# Patient Record
Sex: Female | Born: 1955 | Race: White | Hispanic: No | Smoking: Never smoker
Health system: Southern US, Academic
[De-identification: ages and names within clinical notes are randomized; demographics above are authoritative.]

## PROBLEM LIST (undated history)

## (undated) DIAGNOSIS — E119 Type 2 diabetes mellitus without complications: Secondary | ICD-10-CM

## (undated) DIAGNOSIS — G43909 Migraine, unspecified, not intractable, without status migrainosus: Secondary | ICD-10-CM

## (undated) DIAGNOSIS — K219 Gastro-esophageal reflux disease without esophagitis: Secondary | ICD-10-CM

## (undated) DIAGNOSIS — I10 Essential (primary) hypertension: Secondary | ICD-10-CM

## (undated) HISTORY — DX: Type 2 diabetes mellitus without complications: E11.9

## (undated) HISTORY — DX: Essential (primary) hypertension: I10

## (undated) HISTORY — DX: Migraine, unspecified, not intractable, without status migrainosus: G43.909

## (undated) HISTORY — PX: HX LAP CHOLECYSTECTOMY: SHX56

## (undated) HISTORY — DX: Gastro-esophageal reflux disease without esophagitis: K21.9

## (undated) HISTORY — PX: HX HYSTERECTOMY: SHX81

---

## 1998-06-10 ENCOUNTER — Other Ambulatory Visit (HOSPITAL_COMMUNITY): Payer: Self-pay | Admitting: OBSTETRICS/GYNECOLOGY

## 2014-11-29 DIAGNOSIS — G5603 Carpal tunnel syndrome, bilateral upper limbs: Secondary | ICD-10-CM | POA: Insufficient documentation

## 2014-11-29 DIAGNOSIS — E119 Type 2 diabetes mellitus without complications: Secondary | ICD-10-CM | POA: Insufficient documentation

## 2014-11-29 DIAGNOSIS — I10 Essential (primary) hypertension: Secondary | ICD-10-CM | POA: Insufficient documentation

## 2014-11-29 DIAGNOSIS — M4807 Spinal stenosis, lumbosacral region: Secondary | ICD-10-CM | POA: Insufficient documentation

## 2014-11-29 DIAGNOSIS — N3941 Urge incontinence: Secondary | ICD-10-CM | POA: Insufficient documentation

## 2015-03-07 DIAGNOSIS — J309 Allergic rhinitis, unspecified: Secondary | ICD-10-CM | POA: Insufficient documentation

## 2015-09-13 DIAGNOSIS — E1140 Type 2 diabetes mellitus with diabetic neuropathy, unspecified: Secondary | ICD-10-CM | POA: Insufficient documentation

## 2017-11-03 DIAGNOSIS — K649 Unspecified hemorrhoids: Secondary | ICD-10-CM | POA: Insufficient documentation

## 2017-11-03 DIAGNOSIS — E785 Hyperlipidemia, unspecified: Secondary | ICD-10-CM | POA: Insufficient documentation

## 2017-11-03 DIAGNOSIS — K219 Gastro-esophageal reflux disease without esophagitis: Secondary | ICD-10-CM | POA: Insufficient documentation

## 2017-11-03 DIAGNOSIS — I10 Essential (primary) hypertension: Secondary | ICD-10-CM | POA: Insufficient documentation

## 2017-11-03 DIAGNOSIS — M1990 Unspecified osteoarthritis, unspecified site: Secondary | ICD-10-CM | POA: Insufficient documentation

## 2019-08-09 DIAGNOSIS — R5383 Other fatigue: Secondary | ICD-10-CM | POA: Insufficient documentation

## 2019-08-09 DIAGNOSIS — K5904 Chronic idiopathic constipation: Secondary | ICD-10-CM | POA: Insufficient documentation

## 2021-07-16 DIAGNOSIS — K5900 Constipation, unspecified: Secondary | ICD-10-CM | POA: Insufficient documentation

## 2021-09-04 DIAGNOSIS — M7121 Synovial cyst of popliteal space [Baker], right knee: Secondary | ICD-10-CM | POA: Insufficient documentation

## 2022-07-20 ENCOUNTER — Ambulatory Visit (INDEPENDENT_AMBULATORY_CARE_PROVIDER_SITE_OTHER): Payer: 59 | Admitting: NURSE PRACTITIONER

## 2022-07-20 ENCOUNTER — Other Ambulatory Visit: Payer: Self-pay

## 2022-07-20 ENCOUNTER — Encounter (INDEPENDENT_AMBULATORY_CARE_PROVIDER_SITE_OTHER): Payer: Self-pay | Admitting: NURSE PRACTITIONER

## 2022-07-20 VITALS — Ht 64.0 in | Wt 203.0 lb

## 2022-07-20 DIAGNOSIS — J343 Hypertrophy of nasal turbinates: Secondary | ICD-10-CM

## 2022-07-20 DIAGNOSIS — J3089 Other allergic rhinitis: Secondary | ICD-10-CM

## 2022-07-20 DIAGNOSIS — J329 Chronic sinusitis, unspecified: Secondary | ICD-10-CM

## 2022-07-20 MED ORDER — DOXYCYCLINE HYCLATE 100 MG CAPSULE
100.0000 mg | ORAL_CAPSULE | Freq: Two times a day (BID) | ORAL | 0 refills | Status: DC
Start: 2022-07-20 — End: 2022-09-28

## 2022-07-20 NOTE — Progress Notes (Signed)
ENT, [facility]  [address]  [place] [address]  Phone: [phone]  Fax: [phone]      Encounter Date: 07/20/2022    Patient ID: Miranda Barker  MRN: [id number]    DOB: 1956-10-08  Age: 66 y.o. female         Referring Provider:    No referring provider defined for this encounter.    Reason for Visit:   Chief Complaint   Patient presents with    Sinus Problem     Pt complains of PND, sinus pressure, nasal congestion, sore throat and cough        History of Present Illness:  Miranda Barker is a 66 y.o. female referred for chronic sinusitis.  She complains of daily PND, nasal congestion, and sinus pressure.  She is currently taking Flonase and zyrtec.  She had allergy testing over 15 years ago.  She recently was treated  for sinusitis with Amoxicillin and caused severe yeast infection.      Patient History:  There is no problem list on file for this patient.    Current Outpatient Medications   Medication Sig    doxycycline hyclate (VIBRAMYCIN) 100 mg Oral Capsule Take 1 Capsule (100 mg total) by mouth Twice daily    ergocalciferol, vitamin D2, (DRISDOL) 1,250 mcg (50,000 unit) Oral Capsule     esomeprazole magnesium (NEXIUM) 40 mg Oral Capsule, Delayed Release(E.C.) Take 1 Capsule (40 mg total) by mouth Once a day    famotidine (PEPCID) 20 mg Oral Tablet TAKE 1 TABLET BY MOUTH TWICE DAILY AS DIRECTED    fluticasone propionate (FLONASE) 50 mcg/actuation Nasal Spray, Suspension     glyburide (DIABETA) 1.25 mg Oral Tablet Take 1 Tablet (1.25 mg total) by mouth Every morning    losartan (COZAAR) 100 mg Oral Tablet     metFORMIN (GLUCOPHAGE) 500 mg Oral Tablet     pravastatin (PRAVACHOL) 40 mg Oral Tablet      Allergies   Allergen Reactions    Sulfa (Sulfonamides)      Past Medical History:   Diagnosis Date    Diabetes mellitus (CMS HCC)     Esophageal reflux     Essential hypertension     Migraine       Past Surgical History:   Procedure Laterality Date    HX LAP CHOLECYSTECTOMY        Family  Medical History:    None         Social History     Tobacco Use    Smoking status: Never    Smokeless tobacco: Never   Substance Use Topics    Alcohol use: Never    Drug use: Never       Review of Systems     Vitals:    07/20/22 1058   Weight: 92.1 kg (203 lb)   Height: 1.626 m (5\' 4" )   BMI: 34.92      ENT Physical Exam  Constitutional  Appearance: patient appears well-developed, well-nourished and well-groomed,  Communication/Voice: communication appropriate for developmental age; vocal quality normal;  Head and Face  Appearance: head appears normal, face appears normal and face appears atraumatic;  Palpation: facial palpation normal;  Salivary: glands normal;  Ear  Hearing: intact;  Auricles: right auricle normal; left auricle normal;  External Mastoids: right external mastoid normal; left external mastoid normal;  Ear Canals: right ear canal normal; left ear canal normal;  Tympanic Membranes: right tympanic membrane normal; left tympanic membrane normal;  Nose  External Nose: nares patent bilaterally; external nose normal;  Internal Nose: bilateral intranasal mucosa edematous; nasal septal deviation present; bilateral inferior turbinates with hypertrophy;  Oral Cavity/Oropharynx  Lips: normal;  Teeth: normal;  Gums: gingiva normal;  Tongue: normal;  Oral mucosa: normal;  Hard palate: normal;  Neck  Neck: neck normal; neck palpation normal;  Thyroid: thyroid normal;  Respiratory  Inspection: breathing unlabored; normal breathing rate;  Lymphatic  Palpation: no cervical adenopathy noted;  Neurovestibular  Mental Status: alert and oriented;  Psychiatric: mood normal; affect is appropriate;  Cranial Nerves: cranial nerves intact;     Assessment:  ENCOUNTER DIAGNOSES     ICD-10-CM   1. Chronic sinusitis, unspecified location  J32.9   2. Non-seasonal allergic rhinitis, unspecified trigger  J30.89   3. Nasal turbinate hypertrophy  J34.3       Plan:  Medical records reviewed on 07/20/2022.  Nasal endoscopy shows acute  sinusitis findings, will start doxycycline 100 mg BID and nasal irrigations daily  Allergy testing at follow up and treatment plan pending test results.    Repeat nasal endoscopy at follow up    Orders Placed This Encounter    31231 - NASAL ENDOSCOPY DIAGNOSTIC UNILATERAL OR BILATERAL (AMB ONLY)    doxycycline hyclate (VIBRAMYCIN) 100 mg Oral Capsule     Return for SET in 3 weeks.    Elnora Morrison, FNP-BC  07/20/2022, 11:38

## 2022-07-28 NOTE — Procedures (Signed)
ENT, [facility]  [address]  [place] [address]    Procedure Note    Name: Miranda Barker MRN:  [id number]   Date: 07/20/2022 Age: 66 y.o.       31231 - NASAL ENDOSCOPY DIAGNOSTIC UNILATERAL OR BILATERAL (AMB ONLY)  Performed by: Elnora Morrison, FNP-BC  Authorized by: Elnora Morrison, FNP-BC     Time Out:     Immediately before the procedure, a time out was called:  Yes    Patient verified:  Yes    Procedure Verified:  Yes    Site Verified:  Yes  Documentation:      Indications for procedure: Obstructive nasal breathing and Facial pain / Headache    Anesthesia: Oxymetazoline nasal spray    Description: Nasal endoscopy with rigid scope was performed with examination of the  septum, inferior, middle, and superior meatus, turbinates, sphenoethmoidal recess, and nasopharynx.     There is mucopus bilateral nasal cavity. ET orifices and nasopharynx were normal.     Findings: Inferior turbinate hypertrophy and Acute sinusitis    The patient tolerated the procedure well.             Elnora Morrison, FNP-BC

## 2022-08-13 ENCOUNTER — Ambulatory Visit (INDEPENDENT_AMBULATORY_CARE_PROVIDER_SITE_OTHER): Payer: 59 | Admitting: NURSE PRACTITIONER

## 2022-08-13 ENCOUNTER — Other Ambulatory Visit: Payer: 59 | Attending: NURSE PRACTITIONER

## 2022-08-13 ENCOUNTER — Encounter (INDEPENDENT_AMBULATORY_CARE_PROVIDER_SITE_OTHER): Payer: Self-pay | Admitting: NURSE PRACTITIONER

## 2022-08-13 ENCOUNTER — Other Ambulatory Visit (INDEPENDENT_AMBULATORY_CARE_PROVIDER_SITE_OTHER): Payer: Self-pay

## 2022-08-13 ENCOUNTER — Other Ambulatory Visit (HOSPITAL_COMMUNITY): Payer: 59 | Admitting: NURSE PRACTITIONER

## 2022-08-13 ENCOUNTER — Other Ambulatory Visit: Payer: Self-pay

## 2022-08-13 VITALS — Ht 64.0 in | Wt 203.0 lb

## 2022-08-13 DIAGNOSIS — J329 Chronic sinusitis, unspecified: Secondary | ICD-10-CM

## 2022-08-13 DIAGNOSIS — J3089 Other allergic rhinitis: Secondary | ICD-10-CM

## 2022-08-13 MED ORDER — GUAIFENESIN ER 600 MG TABLET, EXTENDED RELEASE 12 HR
600.0000 mg | EXTENDED_RELEASE_TABLET | Freq: Two times a day (BID) | ORAL | 0 refills | Status: AC
Start: 2022-08-13 — End: ?

## 2022-08-13 NOTE — Procedures (Signed)
[facility], [facility]  [address]  [place] [address]    Procedure Note    Name: JESENYA BOWDITCH MRN:  [id number]   Date: 08/13/2022 Age: 66 y.o.  DOB:   [birth year]       31231 - NASAL ENDOSCOPY DIAGNOSTIC UNILATERAL OR BILATERAL (AMB ONLY)  Performed by: Elnora Morrison, FNP-BC  Authorized by: Elnora Morrison, FNP-BC     Time Out:     Immediately before the procedure, a time out was called:  Yes    Patient verified:  Yes    Procedure Verified:  Yes    Site Verified:  Yes  Documentation:      Indications for procedure: Obstructive nasal breathing and Facial Pain    Anesthesia: Oxymetazoline nasal spray    Description: Nasal endoscopy with rigid scope was performed with examination of the  septum, inferior, middle, and superior meatus, turbinates, sphenoethmoidal recess, and nasopharynx.     There is mucopus bilateral nasal cavity and nasopharynx.  ET orifices and nasopharynx were normal.     Findings: Septal deviation, Inferior turbinate hypertrophy, and Acute sinusitis    The patient tolerated the procedure well.             Elnora Morrison, FNP-BC

## 2022-08-13 NOTE — Progress Notes (Signed)
ENT, [facility]  [address]  [place] [address]  Phone: [phone]  Fax: [phone]      Encounter Date: 08/13/2022    Patient ID: Miranda Barker  MRN: [id number]    DOB: 08-Oct-1956  Age: 67 y.o. female     Progress Note       Referring Provider:  No ref. provider found    Reason for Visit:   Chief Complaint   Patient presents with    Sinus Problem     3 week rc, pt complains of nasal congestion, PND, sore throat and cough        History of Present Illness:  Miranda Barker is a 66 y.o. female follow up sinusitis. She finished doxycycline and no improvement.  She is using nasal irrigations daily.  No improvement in symptoms.  Continues to have PND and sinus pressure constant.      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    doxycycline hyclate (VIBRAMYCIN) 100 mg Oral Capsule Take 1 Capsule (100 mg total) by mouth Twice daily (Patient not taking: Reported on 08/13/2022)    ergocalciferol, vitamin D2, (DRISDOL) 1,250 mcg (50,000 unit) Oral Capsule     esomeprazole magnesium (NEXIUM) 40 mg Oral Capsule, Delayed Release(E.C.) Take 1 Capsule (40 mg total) by mouth Once a day    famotidine (PEPCID) 20 mg  Oral Tablet TAKE 1 TABLET BY MOUTH TWICE DAILY AS DIRECTED    fluticasone propionate (FLONASE) 50 mcg/actuation Nasal Spray, Suspension     glyburide (DIABETA) 1.25 mg Oral Tablet Take 1 Tablet (1.25 mg total) by mouth Every morning    guaiFENesin (MUCINEX) 600 mg Oral Tablet Extended Release 12hr Take 1 Tablet (600 mg total) by mouth Every 12 hours    losartan (COZAAR) 100 mg Oral Tablet     metFORMIN (GLUCOPHAGE) 500 mg Oral Tablet     pravastatin (PRAVACHOL) 40 mg Oral Tablet       Allergies   Allergen Reactions    Tetracycline Nausea/ Vomiting    Bismuth Subcit K-Metronidz-Tcn Nausea/ Vomiting    Cefuroxime Axetil Hives/ Urticaria    Sulfa (Sulfonamides)      Past Medical History:   Diagnosis Date    Diabetes mellitus (CMS HCC)     Esophageal reflux     Essential hypertension     Migraine      Past Surgical History:   Procedure Laterality Date    HX LAP CHOLECYSTECTOMY       Family Medical History:    None  Social History     Tobacco Use    Smoking status: Never    Smokeless tobacco: Never   Substance Use Topics    Alcohol use: Never    Drug use: Never       Review of Systems   Constitutional: Negative.    HENT:  Positive for congestion, postnasal drip, sinus pressure and sinus pain.    Eyes: Negative.    Respiratory:  Positive for cough.    Gastrointestinal: Negative.    Allergic/Immunologic: Negative.    Neurological: Negative.    Hematological: Negative.    Psychiatric/Behavioral: Negative.        Vitals:    08/13/22 1543   Weight: 92.1 kg (203 lb)   Height: 1.626 m (5\' 4" )   BMI: 34.92      ENT Physical Exam  Constitutional  Appearance: patient appears well-developed, well-nourished and well-groomed,  Communication/Voice: communication appropriate for developmental age; vocal quality normal;  Head and Face  Appearance: head appears normal, face appears normal and face appears atraumatic;  Palpation: facial palpation normal;  Salivary: glands normal;  Ear  Hearing: intact;  Auricles: right auricle  normal; left auricle normal;  External Mastoids: right external mastoid normal; left external mastoid normal;  Ear Canals: right ear canal normal; left ear canal normal;  Tympanic Membranes: right tympanic membrane normal; left tympanic membrane normal;  Nose  External Nose: nares patent bilaterally; external nose normal;  Internal Nose: bilateral intranasal mucosa edematous; nasal septal deviation present; bilateral inferior turbinates with hypertrophy;  Oral Cavity/Oropharynx  Lips: normal;  Teeth: normal;  Gums: gingiva normal;  Tongue: normal;  Oral mucosa: normal;  Hard palate: normal;  Neck  Neck: neck normal; neck palpation normal;  Thyroid: thyroid normal;  Respiratory  Inspection: breathing unlabored; normal breathing rate;  Lymphatic  Palpation: no cervical adenopathy noted;  Neurovestibular  Mental Status: alert and oriented;  Psychiatric: mood normal; affect is appropriate;  Cranial Nerves: cranial nerves intact;     Assessment:  ENCOUNTER DIAGNOSES     ICD-10-CM   1. Chronic sinusitis, unspecified location  J32.9   2. Non-seasonal allergic rhinitis, unspecified trigger  J30.89       Plan:  Medical records reviewed on 08/13/2022.  Nasal culture done, will call her with results and further treatment plan  Mucinex 600 mg BID  CT sinuses to be scheduled  Allergy testing at follow up   Continue nasal irrigations daily to BID      Orders Placed This Encounter    31231 - NASAL ENDOSCOPY DIAGNOSTIC UNILATERAL OR BILATERAL (AMB ONLY)    RESPIRATORY CULTURE AND GRAM STAIN, AEROBIC    CT SINUSES WO IV CONTRAST    guaiFENesin (MUCINEX) 600 mg Oral Tablet Extended Release 12hr     No follow-ups on file.    08/15/2022, FNP-BC  08/13/2022, 16:06

## 2022-08-14 ENCOUNTER — Telehealth (INDEPENDENT_AMBULATORY_CARE_PROVIDER_SITE_OTHER): Payer: Self-pay | Admitting: NURSE PRACTITIONER

## 2022-08-16 LAB — RESPIRATORY CULTURE AND GRAM STAIN (PERFORMABLE): GRAM STAIN: NONE SEEN

## 2022-08-19 ENCOUNTER — Telehealth (INDEPENDENT_AMBULATORY_CARE_PROVIDER_SITE_OTHER): Payer: Self-pay | Admitting: NURSE PRACTITIONER

## 2022-08-19 NOTE — Telephone Encounter (Signed)
Pt called asking if the results of her nose culture were back yet

## 2022-09-07 ENCOUNTER — Other Ambulatory Visit: Payer: Self-pay

## 2022-09-07 ENCOUNTER — Inpatient Hospital Stay
Admission: RE | Admit: 2022-09-07 | Discharge: 2022-09-07 | Disposition: A | Discharge status: Home / Self Care | Payer: 59 | Source: Ambulatory Visit | Attending: NURSE PRACTITIONER | Admitting: NURSE PRACTITIONER

## 2022-09-07 DIAGNOSIS — J329 Chronic sinusitis, unspecified: Secondary | ICD-10-CM | POA: Insufficient documentation

## 2022-09-21 ENCOUNTER — Encounter (INDEPENDENT_AMBULATORY_CARE_PROVIDER_SITE_OTHER): Payer: Self-pay | Admitting: NURSE PRACTITIONER

## 2022-09-28 ENCOUNTER — Other Ambulatory Visit: Payer: Self-pay

## 2022-09-28 ENCOUNTER — Encounter (INDEPENDENT_AMBULATORY_CARE_PROVIDER_SITE_OTHER): Payer: Self-pay | Admitting: NURSE PRACTITIONER

## 2022-09-28 ENCOUNTER — Ambulatory Visit (INDEPENDENT_AMBULATORY_CARE_PROVIDER_SITE_OTHER): Payer: 59 | Admitting: NURSE PRACTITIONER

## 2022-09-28 VITALS — Ht 64.0 in | Wt 203.0 lb

## 2022-09-28 DIAGNOSIS — J3489 Other specified disorders of nose and nasal sinuses: Secondary | ICD-10-CM

## 2022-09-28 DIAGNOSIS — J329 Chronic sinusitis, unspecified: Secondary | ICD-10-CM

## 2022-09-28 DIAGNOSIS — J3089 Other allergic rhinitis: Secondary | ICD-10-CM

## 2022-09-28 DIAGNOSIS — J343 Hypertrophy of nasal turbinates: Secondary | ICD-10-CM

## 2022-09-28 MED ORDER — LEVOCETIRIZINE 5 MG TABLET
5.0000 mg | ORAL_TABLET | Freq: Every evening | ORAL | 3 refills | Status: AC
Start: 2022-09-28 — End: ?

## 2022-09-28 MED ORDER — FLUNISOLIDE 25 MCG (0.025 %) NASAL SPRAY
2.0000 | Freq: Two times a day (BID) | NASAL | 5 refills | Status: AC
Start: 2022-09-28 — End: ?

## 2022-09-28 NOTE — Progress Notes (Signed)
ENT, [facility]  [place] [address]  Phone: [phone]  Fax: [phone]      Encounter Date: 09/28/2022    Patient ID: Miranda Barker  MRN: [id number]    DOB: January 07, 1956  Age: 66 y.o. female     Progress Note       Referring Provider:  No ref. provider found    Reason for Visit:   Chief Complaint   Patient presents with    Follow-up After Testing     Rc after ct, pt complains of sneezing, watery eyes, PND, nasal congestion, cough and sore throat        History of Present Illness:  Miranda Barker is a 67 y.o. female follow up sinusitis.  CT sinuses completed due to persistent sinusitis symptoms and multiple antibiotics. She is having increased nasal congestion and PND for several week.  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)    Osteoarthrosis    Spinal stenosis of lumbosacral region    Synovial cyst of right popliteal space    Type 2 diabetes mellitus (CMS HCC)    Urge incontinence of urine     Current Outpatient Medications   Medication Sig    albuterol sulfate (PROVENTIL OR VENTOLIN OR PROAIR) 90 mcg/actuation Inhalation oral inhaler Take 2 Puffs by inhalation Every 4 hours as needed    ergocalciferol, vitamin D2, (DRISDOL) 1,250 mcg (50,000 unit) Oral Capsule     esomeprazole magnesium (NEXIUM) 40 mg Oral Capsule, Delayed Release(E.C.) Take 1 Capsule (40 mg total) by mouth Once a day    famotidine (PEPCID) 20 mg Oral Tablet TAKE 1 TABLET BY MOUTH TWICE DAILY AS DIRECTED    Flunisolide (NASALIDE) 25 mcg (0.025 %) Nasal Spray, Non-Aerosol Administer 2 Sprays into affected nostril(s) Twice daily    glyburide (DIABETA) 1.25 mg Oral Tablet  Take 1 Tablet (1.25 mg total) by mouth Every morning    guaiFENesin (MUCINEX) 600 mg Oral Tablet Extended Release 12hr Take 1 Tablet (600 mg total) by mouth Every 12 hours    Levocetirizine (XYZAL) 5 mg Oral Tablet Take 1 Tablet (5 mg total) by mouth Every evening    losartan (COZAAR) 100 mg Oral Tablet     metFORMIN (GLUCOPHAGE) 500 mg Oral Tablet     pravastatin (PRAVACHOL) 40 mg Oral Tablet       Allergies   Allergen Reactions    Tetracycline Nausea/ Vomiting    Bismuth Subcit K-Metronidz-Tcn Nausea/ Vomiting    Cefuroxime Axetil Hives/ Urticaria    Sulfa (Sulfonamides)      Past Medical History:   Diagnosis Date    Diabetes mellitus (CMS HCC)     Esophageal reflux     Essential hypertension     Migraine      Past Surgical History:   Procedure Laterality Date    HX LAP CHOLECYSTECTOMY       Family Medical History:    None         Social History     Tobacco Use    Smoking status:  Never    Smokeless tobacco: Never   Substance Use Topics    Alcohol use: Never    Drug use: Never       Review of Systems     Vitals:    09/28/22 1342   Weight: 92.1 kg (203 lb)   Height: 1.626 m (5\' 4" )   BMI: 34.92      ENT Physical Exam  Constitutional  Appearance: patient appears well-developed, well-nourished and well-groomed,  Communication/Voice: communication appropriate for developmental age; vocal quality normal;  Head and Face  Appearance: head appears normal, face appears normal and face appears atraumatic;  Palpation: facial palpation normal;  Salivary: glands normal;  Ear  Hearing: intact;  Auricles: right auricle normal; left auricle normal;  External Mastoids: right external mastoid normal; left external mastoid normal;  Ear Canals: right ear canal normal; left ear canal normal;  Tympanic Membranes: right tympanic membrane normal; left tympanic membrane normal;  Nose  External Nose: nares patent bilaterally; external nose normal;  Internal Nose: bilateral intranasal mucosa edematous; nasal septal deviation present;  bilateral inferior turbinates with hypertrophy;  Oral Cavity/Oropharynx  Lips: normal;  Teeth: normal;  Gums: gingiva normal;  Tongue: normal;  Oral mucosa: normal;  Hard palate: normal;  Neck  Neck: neck normal; neck palpation normal;  Thyroid: thyroid normal;  Respiratory  Inspection: breathing unlabored; normal breathing rate;  Lymphatic  Palpation: no cervical adenopathy noted;  Neurovestibular  Mental Status: alert and oriented;  Psychiatric: mood normal; affect is appropriate;  Cranial Nerves: cranial nerves intact;       Assessment:  ENCOUNTER DIAGNOSES     ICD-10-CM   1. 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 Nasal turbinate hypertrophy  J34.3   4. Chronic sinusitis, unspecified location  J32.9       Plan:  Medical records reviewed on 09/28/2022.  Reviewed CT sinuses report and images with patient.  Right maxillary retention cyst.  Also noted on my interpretation is bilateral concha bullosa and a very small mucosal cyst left maxillary sinus.  Discussed turbinate surgery, she does not want surgery  Will start Xyzal 5 mg daily and flunisolide spray daily to BID  Allergy testing at follow up.    Orders Placed This Encounter    Flunisolide (NASALIDE) 25 mcg (0.025 %) Nasal Spray, Non-Aerosol    Levocetirizine (XYZAL) 5 mg Oral Tablet     Return for SET.    09/30/2022, FNP-BC  09/28/2022, 13:47

## 2022-10-26 ENCOUNTER — Encounter (INDEPENDENT_AMBULATORY_CARE_PROVIDER_SITE_OTHER): Payer: Self-pay | Admitting: NURSE PRACTITIONER

## 2022-10-26 ENCOUNTER — Ambulatory Visit (INDEPENDENT_AMBULATORY_CARE_PROVIDER_SITE_OTHER): Payer: Self-pay

## 2023-02-10 ENCOUNTER — Other Ambulatory Visit (HOSPITAL_COMMUNITY): Payer: Self-pay | Admitting: NURSE PRACTITIONER

## 2023-02-10 DIAGNOSIS — Z1231 Encounter for screening mammogram for malignant neoplasm of breast: Secondary | ICD-10-CM

## 2023-02-23 ENCOUNTER — Inpatient Hospital Stay
Admission: RE | Admit: 2023-02-23 | Discharge: 2023-02-23 | Disposition: A | Discharge status: Home / Self Care | Payer: 59 | Source: Ambulatory Visit | Attending: NURSE PRACTITIONER | Admitting: NURSE PRACTITIONER

## 2023-02-23 ENCOUNTER — Other Ambulatory Visit: Payer: Self-pay

## 2023-02-23 ENCOUNTER — Encounter (HOSPITAL_COMMUNITY): Payer: Self-pay

## 2023-02-23 DIAGNOSIS — Z1231 Encounter for screening mammogram for malignant neoplasm of breast: Secondary | ICD-10-CM | POA: Insufficient documentation

## 2023-02-24 ENCOUNTER — Ambulatory Visit (HOSPITAL_COMMUNITY): Payer: Self-pay

## 2023-02-24 DIAGNOSIS — Z1231 Encounter for screening mammogram for malignant neoplasm of breast: Secondary | ICD-10-CM

## 2023-08-11 IMAGING — DX BILATERAL HAND AND WRIST  XRAY
1 series · 3 of 3 positions shown · non-contrast
Comparison: None available.

﻿EXAM:  [DATE]      BILATERAL HAND AND WRIST  XRAY
INDICATION: 66-year-old female with seronegative rheumatoid arthritis.

[Series 1: PA · 0.14mm/px · 3 of 3 slices shown]
[im 1/3]
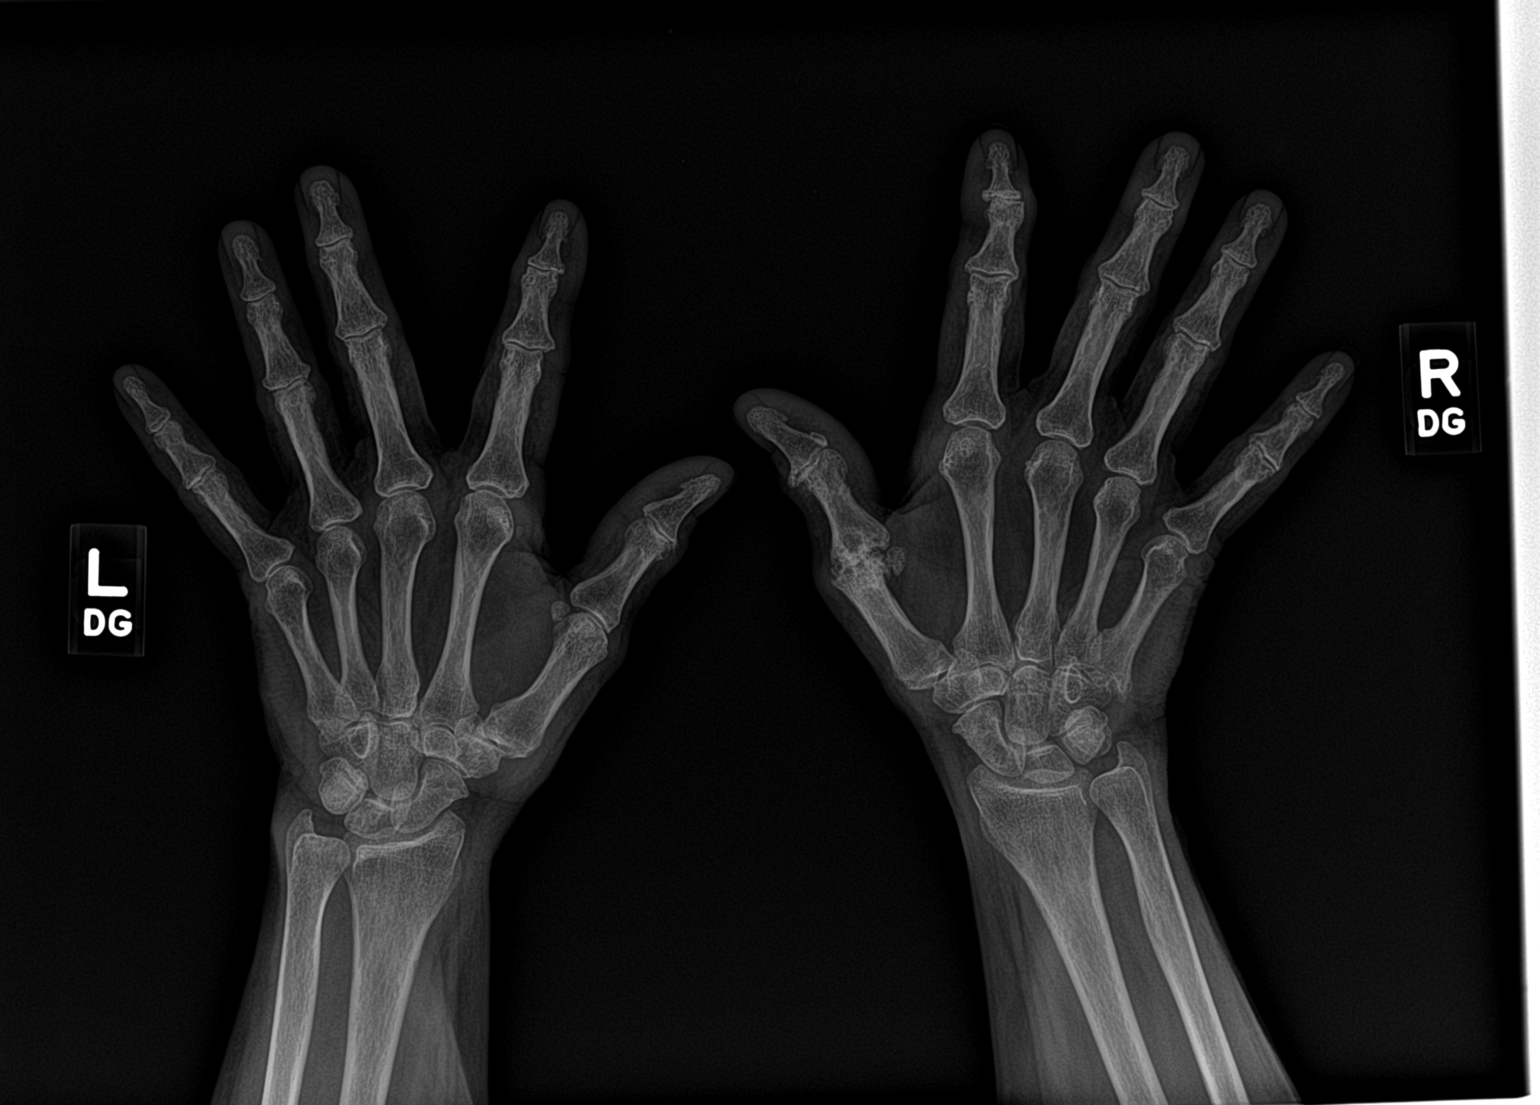
[im 2/3]
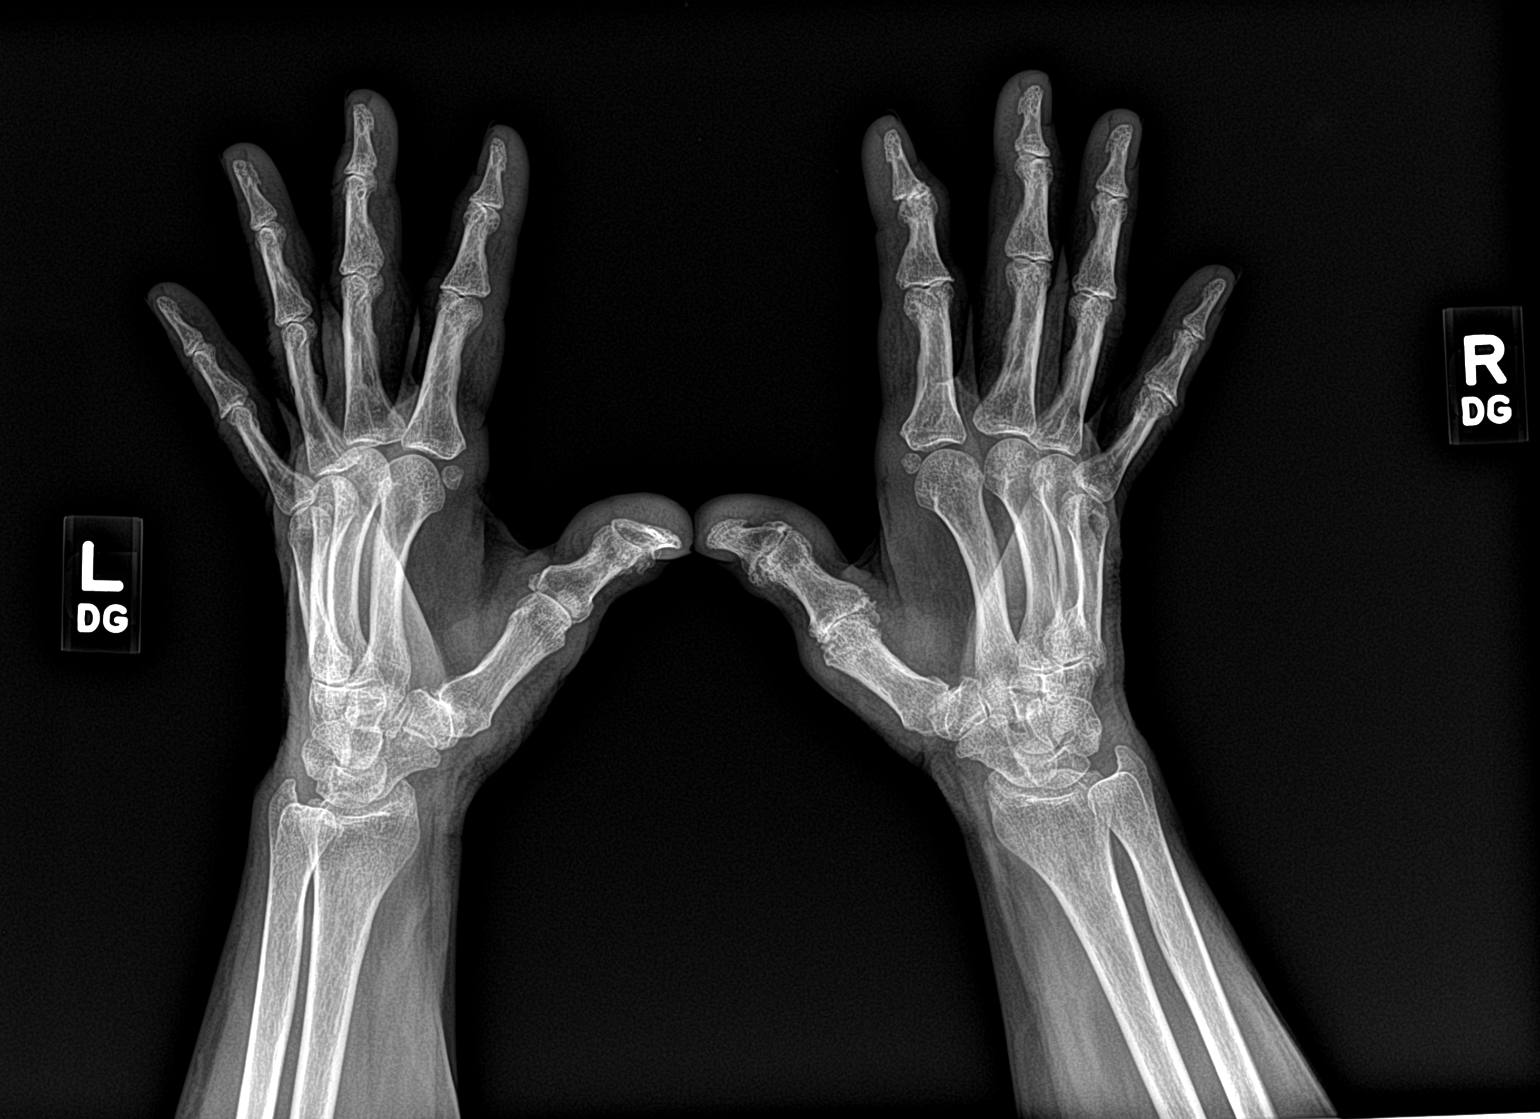
[im 3/3]
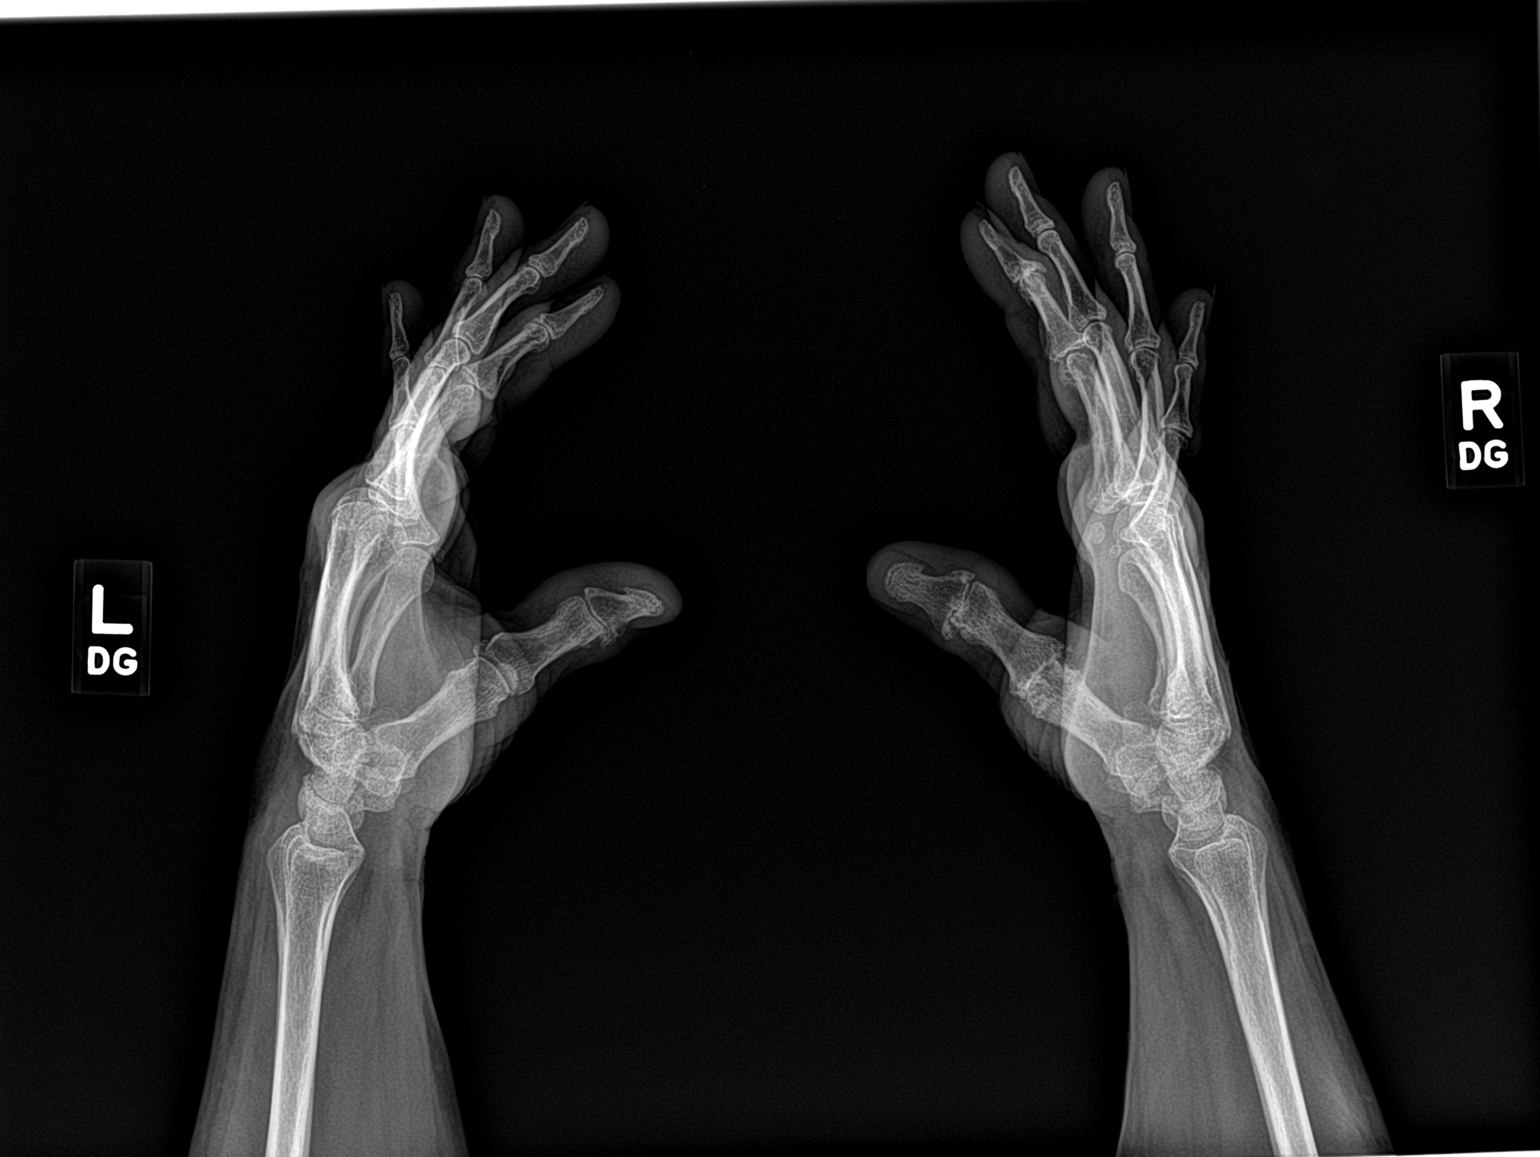

[3 of 3 positions shown; findings below may reference images not displayed]

FINDINGS: Bone density is preserved. Radiocarpal joints and intercarpal joints are normal. 

Significant loss of joint space and irregular articular surface and periarticular erosive changes and cyst formation are noted at the metacarpophalangeal joint of the thumb in the right hand.  Significant osteoarthritis of distal interphalangeal joint of right index finger and interphalangeal joint of thumb with milder arthritic changes of the remaining interphalangeal joints.  Metacarpophalangeal joints do not show erosive changes in the left hand. Interphalangeal joints show mild distal interphalangeal joint degenerative changes in the left hand.
IMPRESSION: 1. Generalized bone density is normal.  Relatively normal wrist joints and intercarpal joints with mild degenerative changes of the 1st carpometacarpal joints. 

2. Significant abnormality as described above of the metacarpophalangeal joint of right thumb with irregular significant loss of joint space and periarticular erosive changes and cyst formation.

3. Osteoarthritis of interphalangeal joints as described above predominantly involving distal interphalangeal joints of right index finger and interphalangeal joint of right thumb.

## 2023-08-11 IMAGING — DX XRAY CHEST 2 VIEWS
1 series · 2 of 2 positions shown · non-contrast
Comparison: None available.

﻿EXAM:  [id number]   XRAY CHEST 2 VIEWS
INDICATION: 66-year-old female with seronegative rheumatoid arthritis.
TECHNIQUE: Two views.

[Series 1: PA · 0.14mm/px · 2 of 2 slices shown]
[im 1/2]
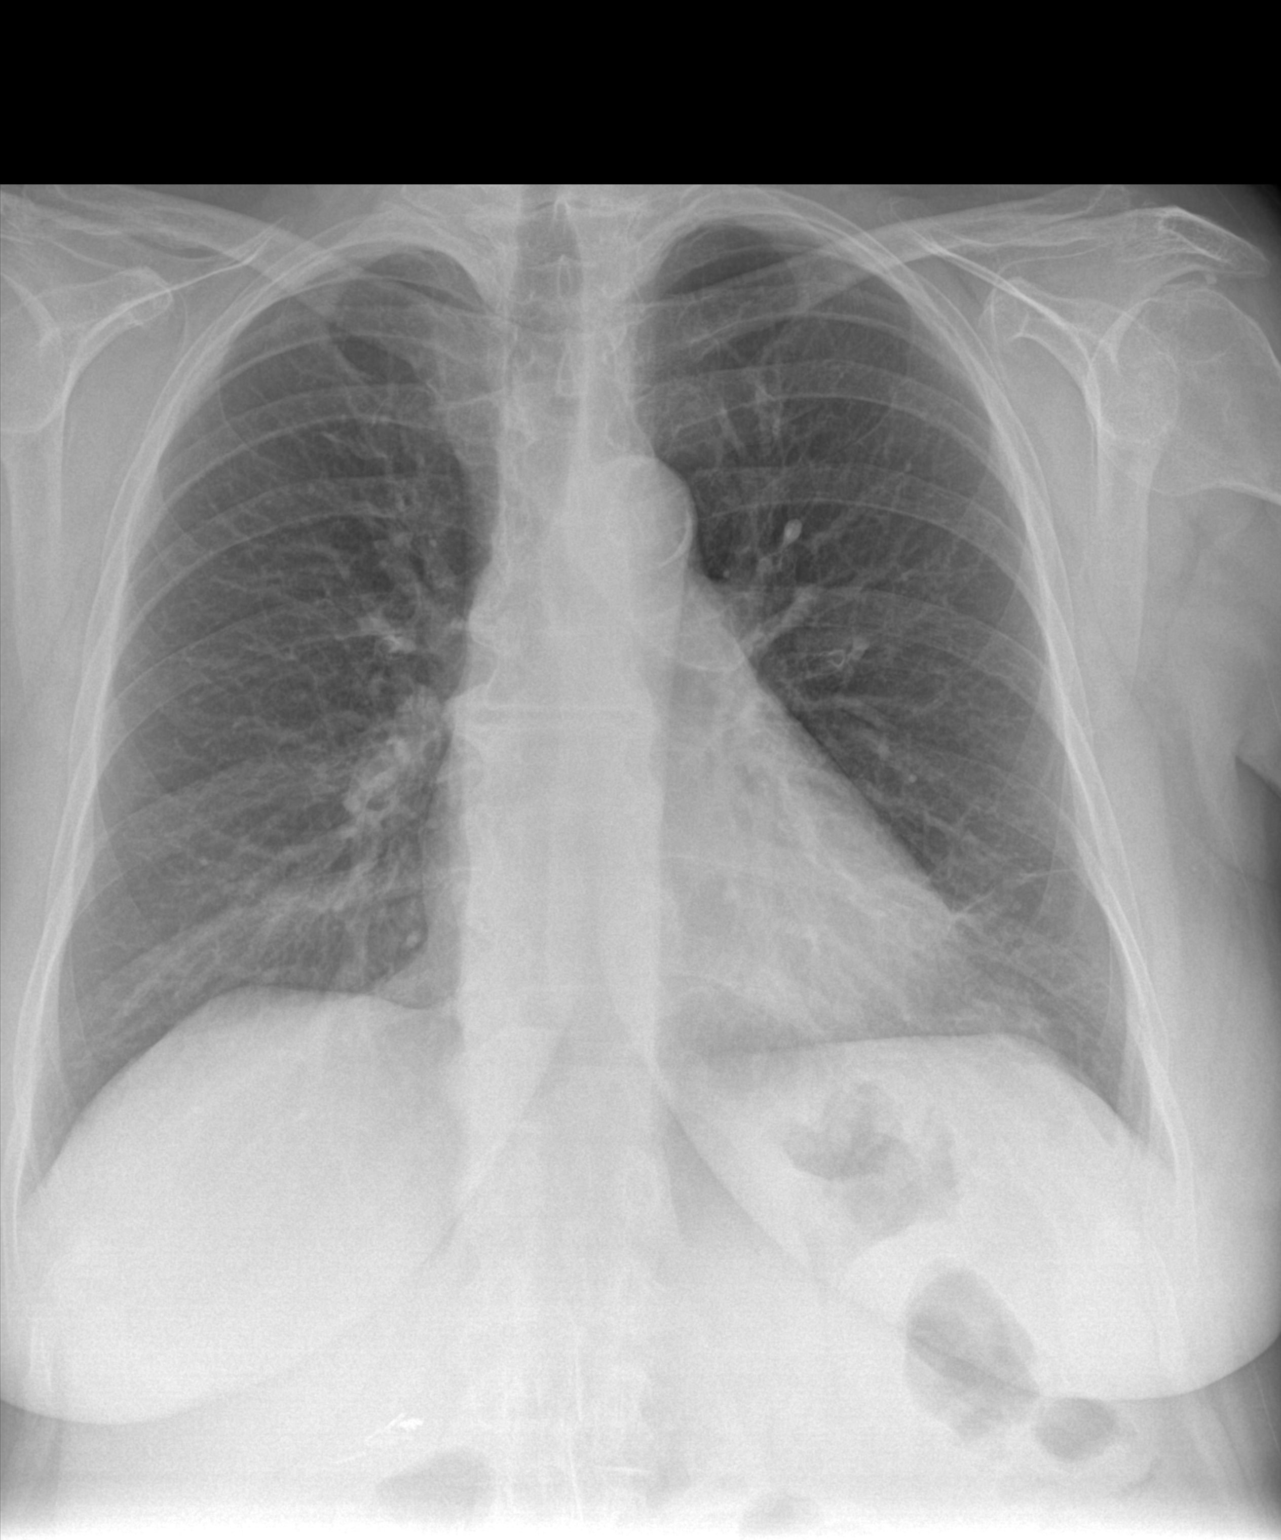
[im 2/2]
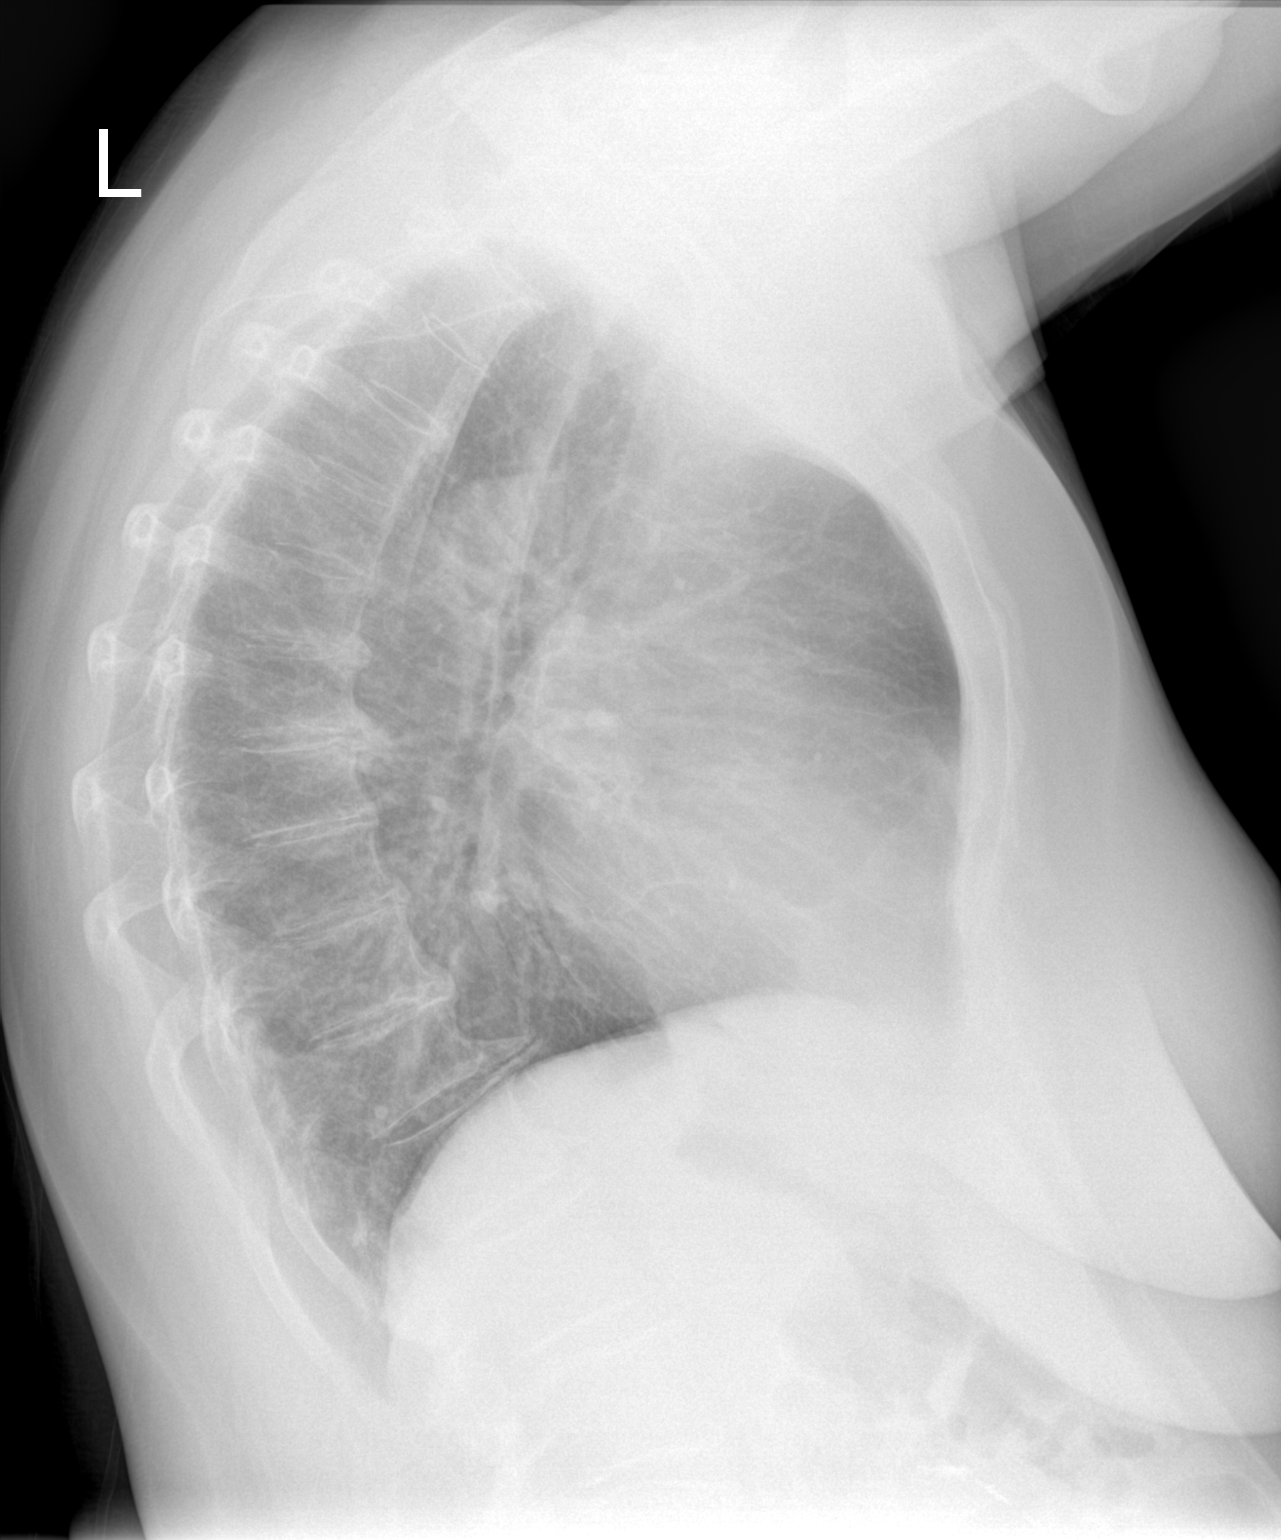

[2 of 2 positions shown; findings below may reference images not displayed]

FINDINGS: Cardiothoracic ratio measures 0.5. Atherosclerotic aorta. Lungs are clear of focal or acute lesions. Hila and mediastinum are normal.
IMPRESSION: No pulmonary abnormalities.  Borderline enlargement of heart and significant atherosclerotic aorta.

## 2023-08-11 IMAGING — DX XRAY KNEES BIL STANDING AP
2 series · 3 of 3 positions shown · non-contrast
Comparison: Unavailable.

﻿EXAM:  [id number]   XRAY KNEES BIL STANDING AP AND LATERAL VIEWS
INDICATION: Bilateral knee pain.
TECHNIQUE: AP weight-bearing views and lateral views.

[Series 1: apweightbearing · 0.14mm/px · 2 of 2 slices shown]
[im 1/2]
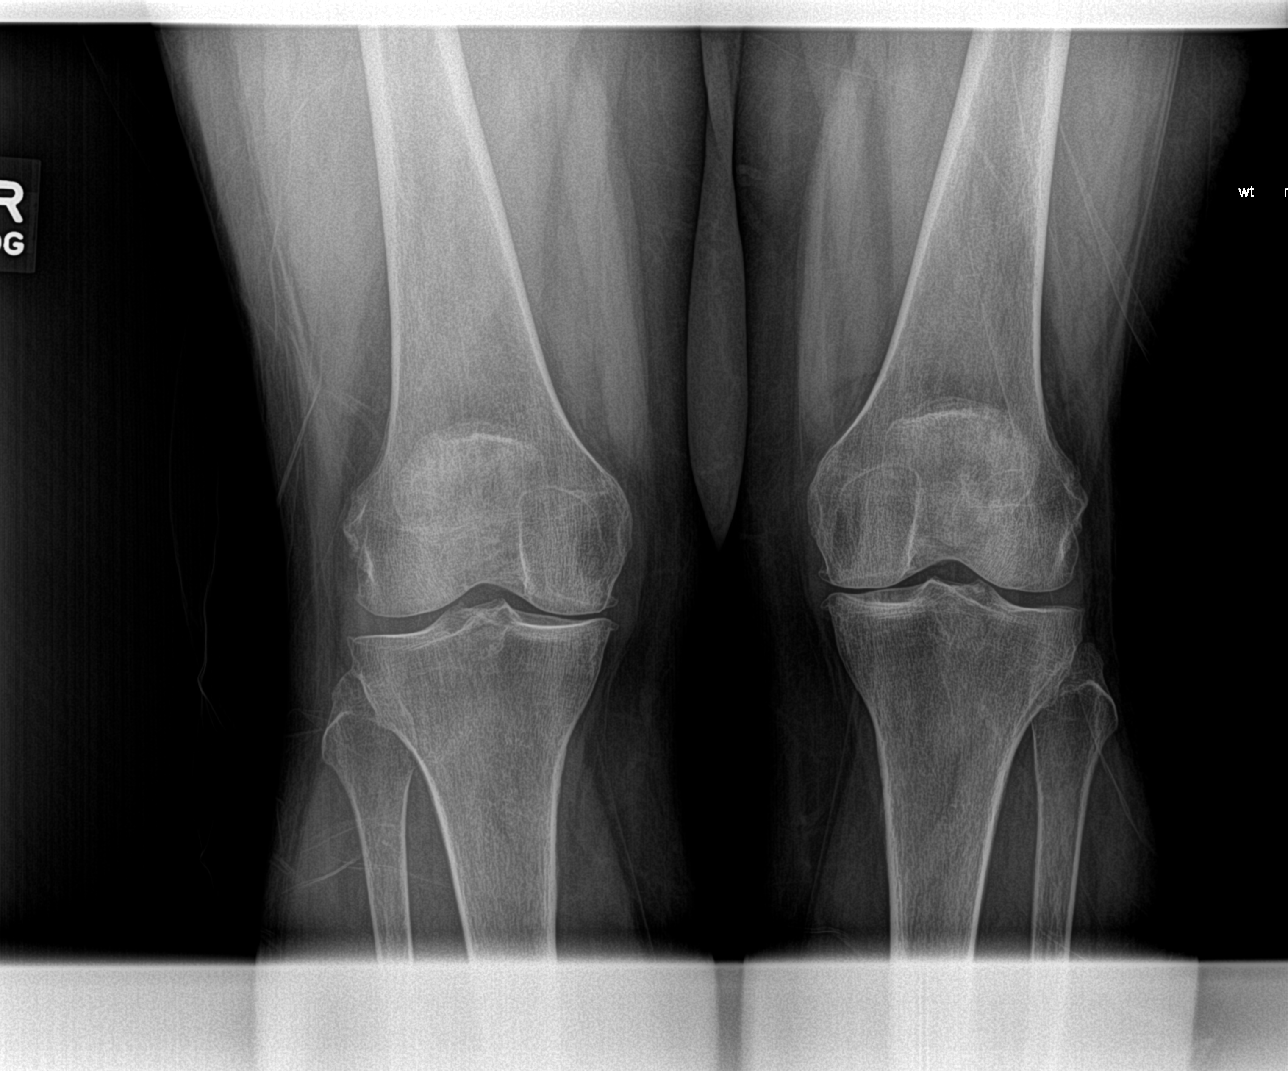
[im 2/2]
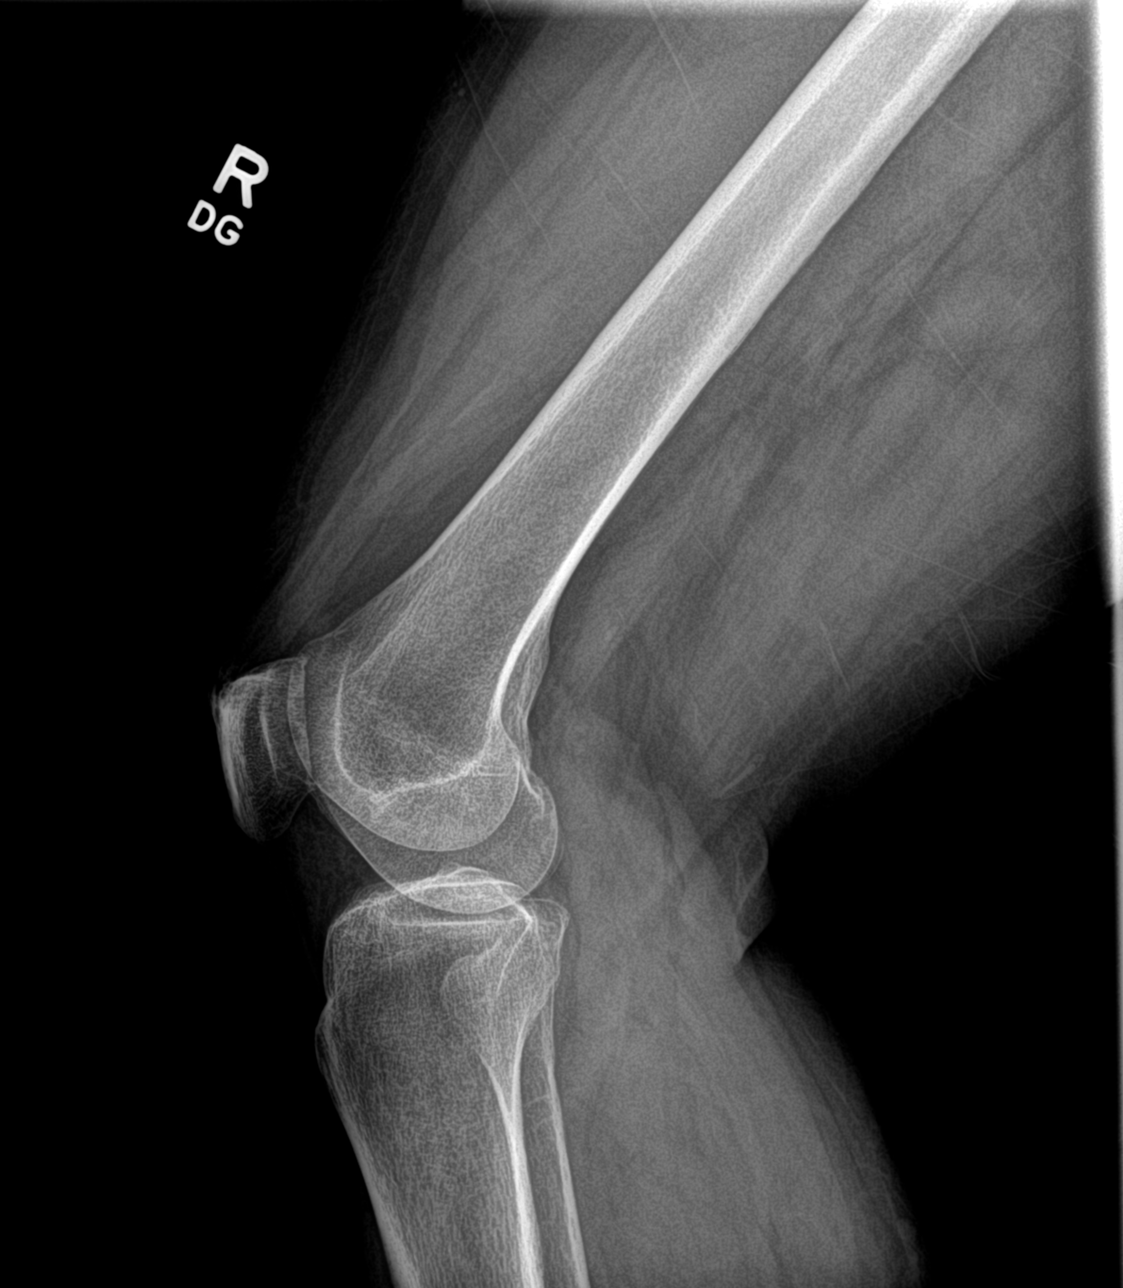

[lateral]
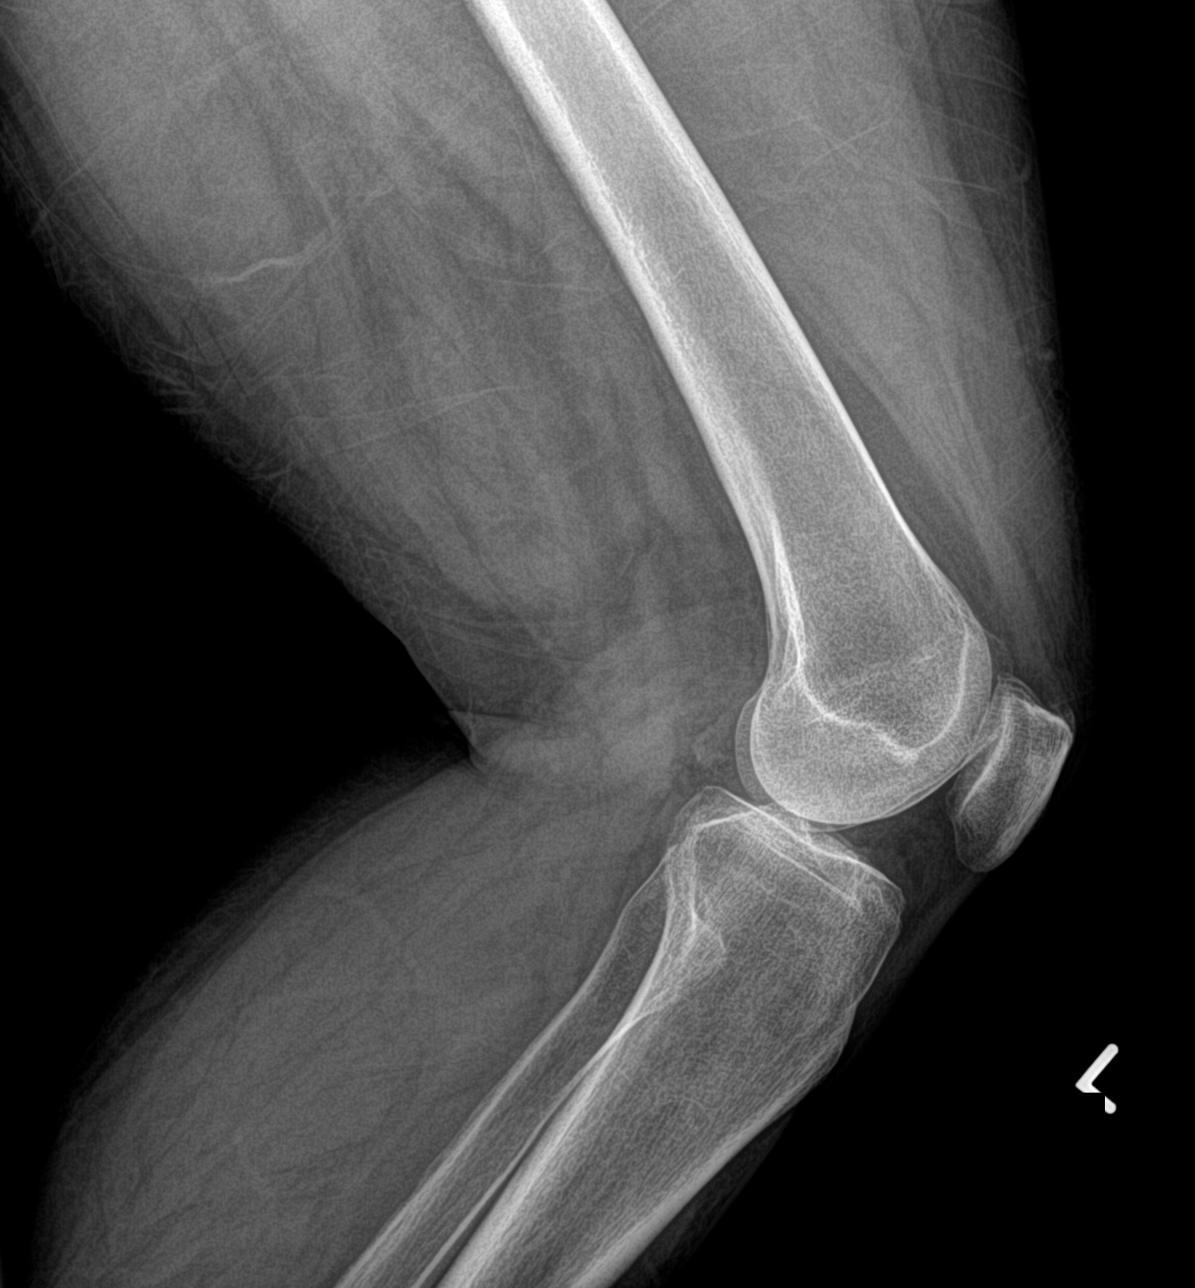

[3 of 3 positions shown; findings below may reference images not displayed]

FINDINGS: Grade 3 degenerative changes of medial compartments of both knee joints are noted. Lateral compartments and patellofemoral joints are relatively normal.  Soft tissues are normal.
IMPRESSION: Grade 3 degenerative changes of medial compartment of both knee joints, right worse than left.

## 2023-08-18 IMAGING — MR MRI LUMBAR SPINE WITHOUT CONTRAST
5 of 6 series · 33 of 48 positions shown · non-contrast
Comparison: None available.

﻿EXAM:  [id number]   MRI LUMBAR SPINE WITHOUT CONTRAST
INDICATION: Low back pain, right hip and leg pain.
TECHNIQUE: Noncontrast multiplanar, multisequence MRI was performed.

[Series 6: T2 · sagittal · 5.0mm · 0.94mm/px · 6 of 13 slices shown (1 of 3)]
[im 1/13]
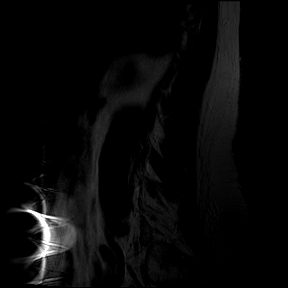
[im 3/13]
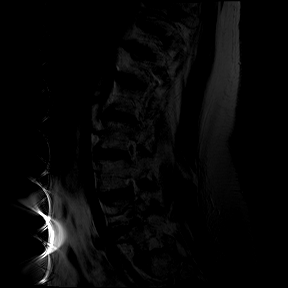
[im 5/13]
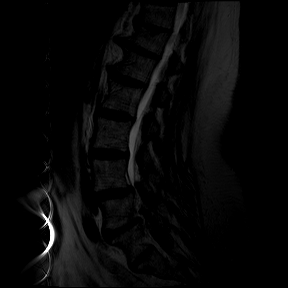
[im 8/13]
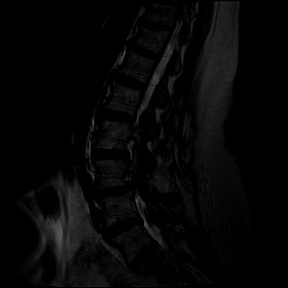
[im 10/13]
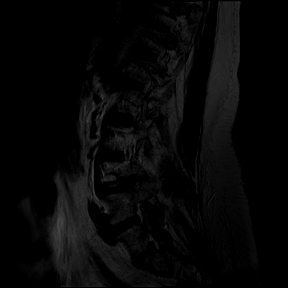
[im 13/13]
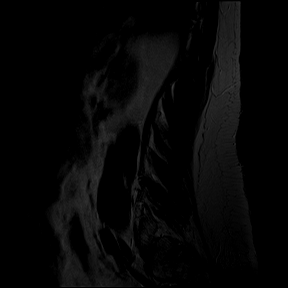

[Series 14: T1 · sagittal · 5.0mm · 0.90mm/px · 7 of 14 slices shown (1 of 2)]
[im 1/14]
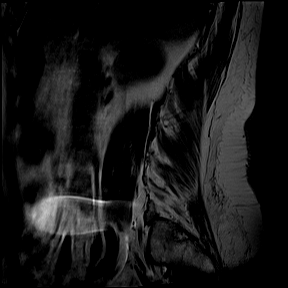
[im 3/14]
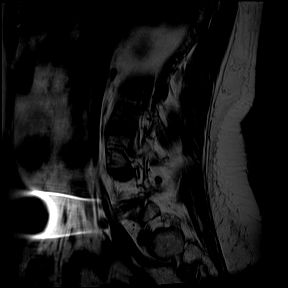
[im 5/14]
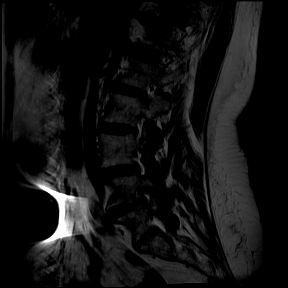
[im 7/14]
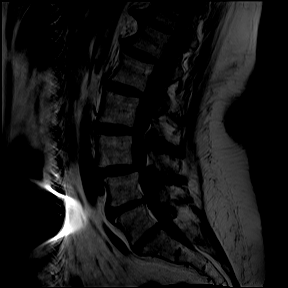
[im 9/14]
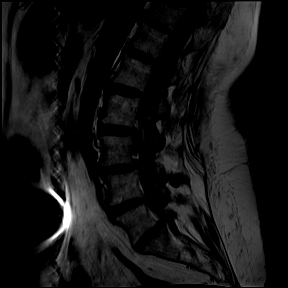
[im 11/14]
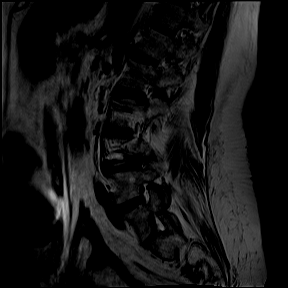
[im 14/14]
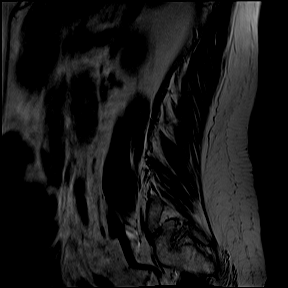

[Series 16: T2 · oblique · 4.0mm · 0.52mm/px · 8 of 22 slices shown (2 of 3)]
[im 1/22]
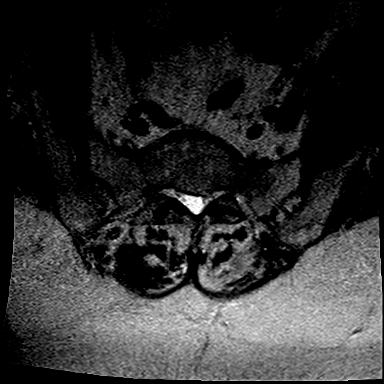
[im 3/22]
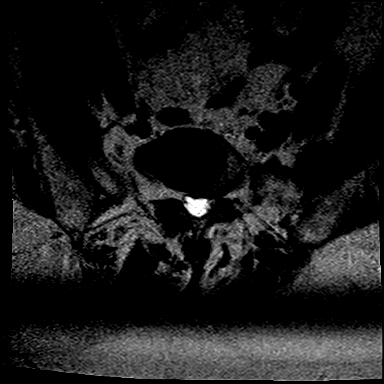
[im 8/22]
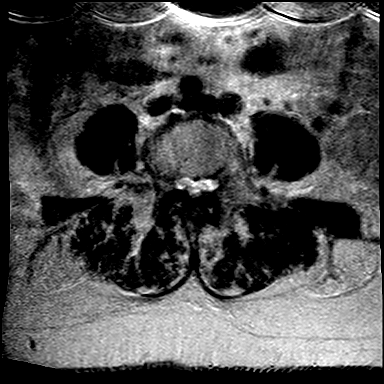
[im 10/22]
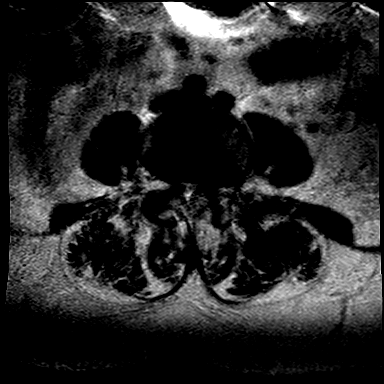
[im 12/22]
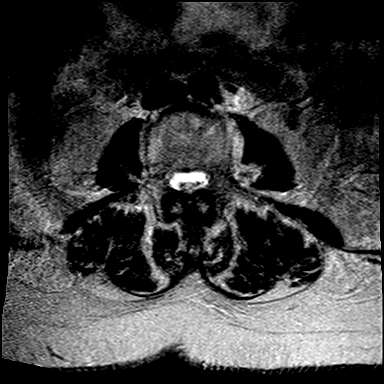
[im 15/22]
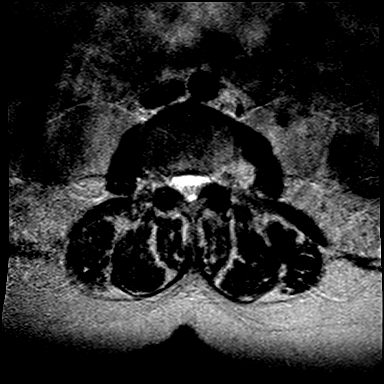
[im 19/22]
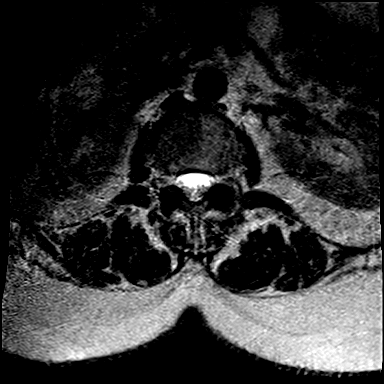
[im 22/22]
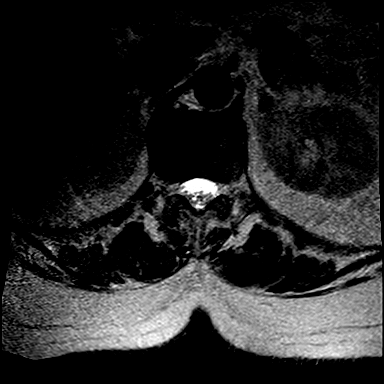

[Series 17: T1 · oblique · 4.0mm · 0.52mm/px · 3 of 22 slices shown (2 of 2)]
[im 1/22]
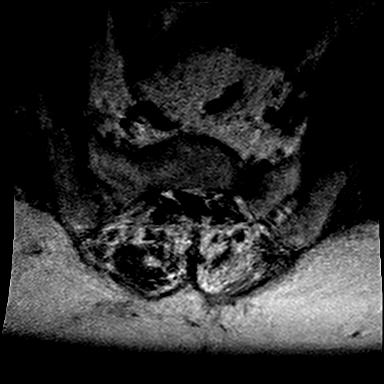
[im 3/22]
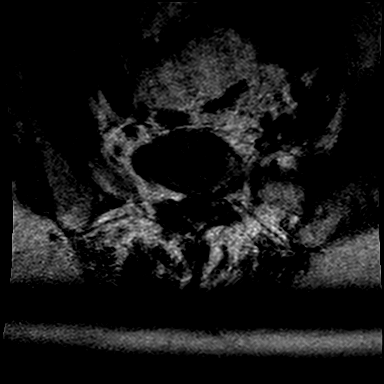
[im 8/22]
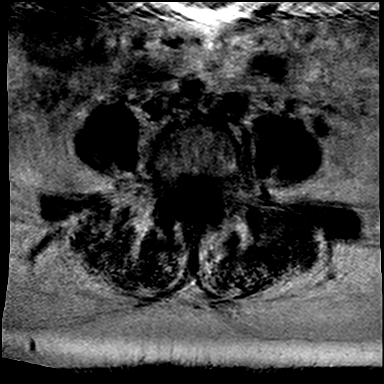

[Series 18: T2 · coronal · 5.0mm · 0.82mm/px · 9 of 19 slices shown (3 of 3)]
[im 1/19]
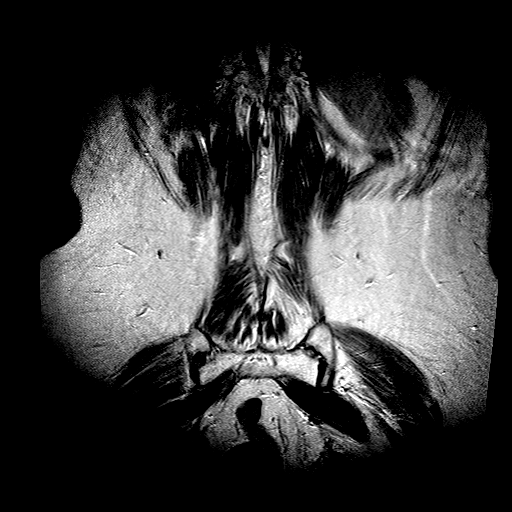
[im 3/19]
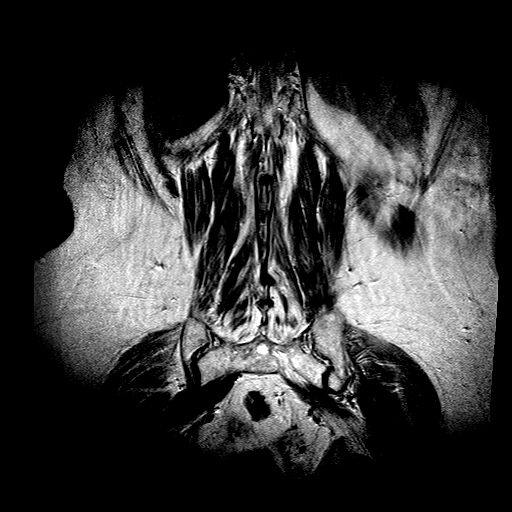
[im 5/19]
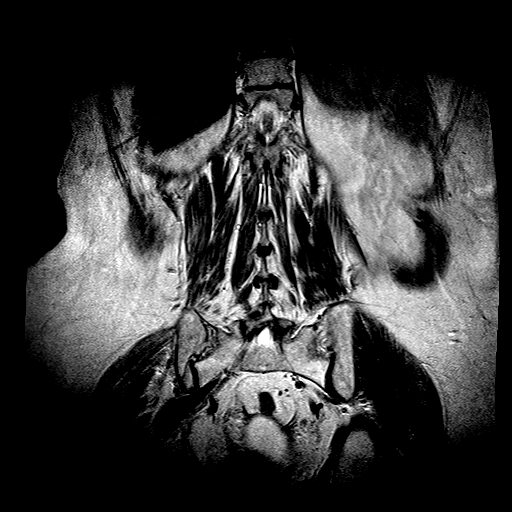
[im 7/19]
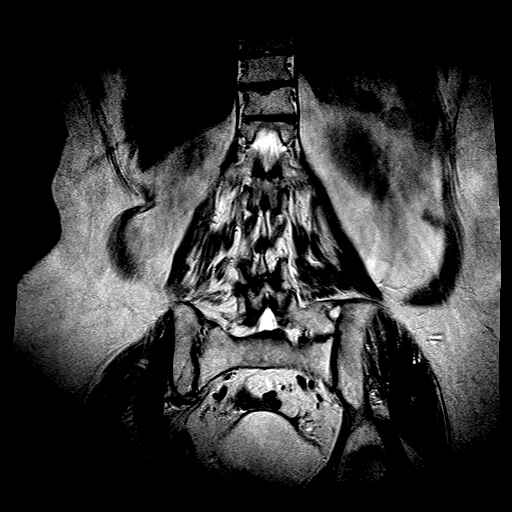
[im 10/19]
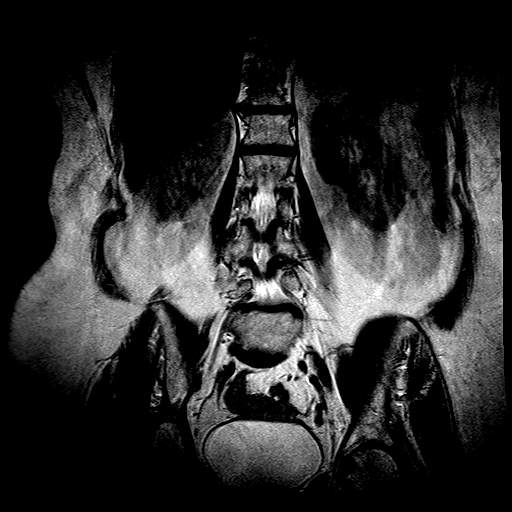
[im 12/19]
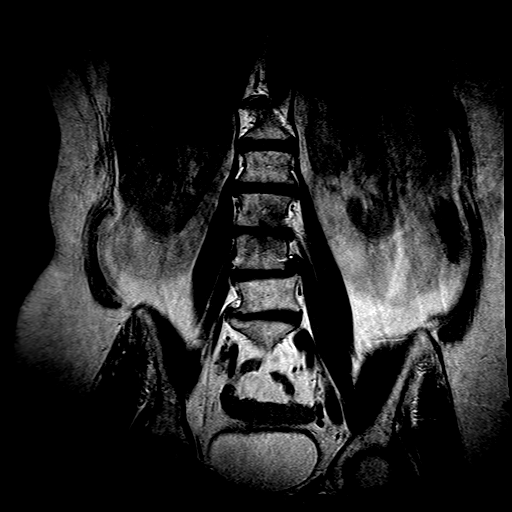
[im 14/19]
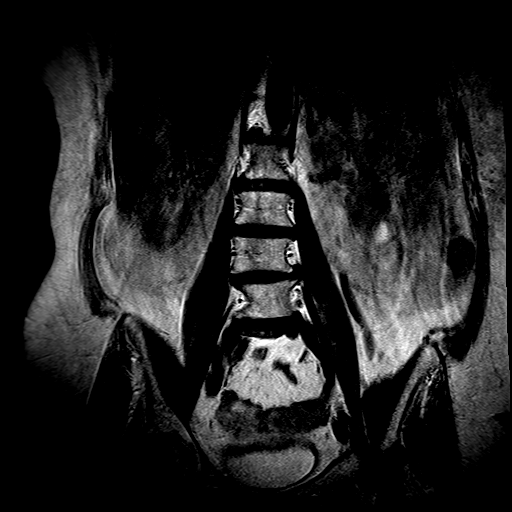
[im 16/19]
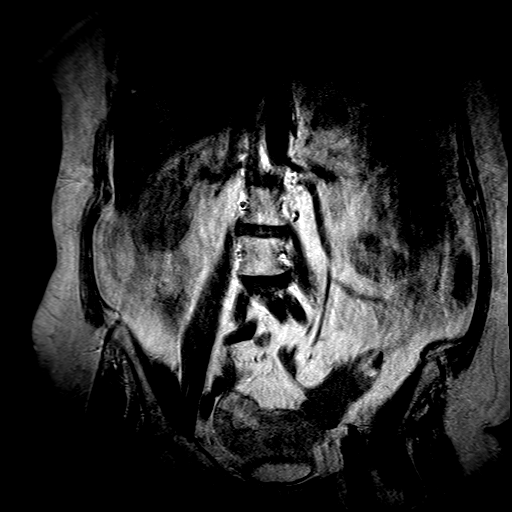
[im 19/19]
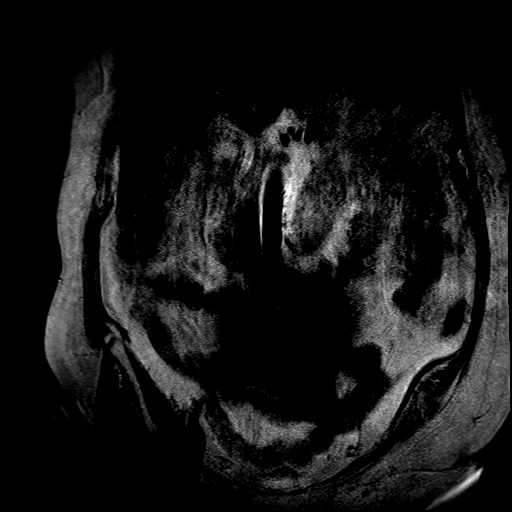

[33 of 48 positions shown; findings below may reference images not displayed]

FINDINGS: This examination is limited by motion artifact and susceptibility artifact.  

At L3-L4, there is mild disc bulging and severe spinal stenosis.  

At L4-L5, there is mild disc bulging and mild spinal stenosis. 

There are mild to moderate degenerative changes at multiple levels.  There is no fracture or malalignment.  The conus appears unremarkable.
IMPRESSION: Severe L3-L4 spinal stenosis.

## 2024-01-27 ENCOUNTER — Other Ambulatory Visit (HOSPITAL_COMMUNITY): Payer: Self-pay

## 2024-01-27 DIAGNOSIS — Z1231 Encounter for screening mammogram for malignant neoplasm of breast: Secondary | ICD-10-CM

## 2024-02-25 ENCOUNTER — Ambulatory Visit
Admission: RE | Admit: 2024-02-25 | Discharge: 2024-02-25 | Disposition: A | Discharge status: Home / Self Care | Payer: Self-pay | Source: Ambulatory Visit

## 2024-02-25 ENCOUNTER — Encounter (HOSPITAL_COMMUNITY): Payer: Self-pay

## 2024-02-25 ENCOUNTER — Other Ambulatory Visit: Payer: Self-pay

## 2024-02-25 DIAGNOSIS — Z1231 Encounter for screening mammogram for malignant neoplasm of breast: Secondary | ICD-10-CM

## 2024-08-24 ENCOUNTER — Emergency Department
Admission: EM | Admit: 2024-08-24 | Discharge: 2024-08-24 | Disposition: A | Discharge status: Home / Self Care | Attending: Emergency Medicine | Admitting: Emergency Medicine

## 2024-08-24 ENCOUNTER — Emergency Department (HOSPITAL_COMMUNITY)

## 2024-08-24 ENCOUNTER — Other Ambulatory Visit: Payer: Self-pay

## 2024-08-24 DIAGNOSIS — L249 Irritant contact dermatitis, unspecified cause: Secondary | ICD-10-CM

## 2024-08-24 DIAGNOSIS — R221 Localized swelling, mass and lump, neck: Secondary | ICD-10-CM

## 2024-08-24 DIAGNOSIS — L988 Other specified disorders of the skin and subcutaneous tissue: Secondary | ICD-10-CM | POA: Insufficient documentation

## 2024-08-24 DIAGNOSIS — R238 Other skin changes: Secondary | ICD-10-CM

## 2024-08-24 LAB — CBC WITH DIFF
BASOPHIL #: 0.1 x10ˆ3/uL (ref 0.00–0.10)
BASOPHIL %: 1 % (ref 0–1)
EOSINOPHIL #: 0.1 x10ˆ3/uL (ref 0.00–0.50)
EOSINOPHIL %: 1 % (ref 1–7)
HCT: 42.1 % — ABNORMAL HIGH (ref 31.2–41.9)
HGB: 14.5 g/dL — ABNORMAL HIGH (ref 10.9–14.3)
LYMPHOCYTE #: 2.8 x10ˆ3/uL (ref 1.10–3.10)
LYMPHOCYTE %: 36 % (ref 16–46)
MCH: 30.4 pg (ref 24.7–32.8)
MCHC: 34.4 g/dL (ref 32.3–35.6)
MCV: 88.2 fL (ref 75.5–95.3)
MONOCYTE #: 0.5 x10ˆ3/uL (ref 0.20–0.90)
MONOCYTE %: 7 % (ref 4–11)
MPV: 8.5 fL (ref 7.9–10.8)
NEUTROPHIL #: 4.3 x10ˆ3/uL (ref 1.90–8.20)
NEUTROPHIL %: 55 % (ref 43–77)
PLATELETS: 302 x10ˆ3/uL (ref 140–440)
RBC: 4.77 x10ˆ6/uL (ref 3.63–4.92)
RDW: 12.8 % (ref 12.3–17.7)
WBC: 7.8 x10ˆ3/uL (ref 3.8–11.8)

## 2024-08-24 LAB — COMPREHENSIVE METABOLIC PANEL, NON-FASTING
ALBUMIN/GLOBULIN RATIO: 1.3 (ref 0.8–1.4)
ALBUMIN: 4.5 g/dL (ref 3.5–5.7)
ALKALINE PHOSPHATASE: 94 U/L (ref 34–104)
ALT (SGPT): 23 U/L (ref 7–52)
ANION GAP: 9 mmol/L (ref 4–13)
AST (SGOT): 23 U/L (ref 13–39)
BILIRUBIN TOTAL: 0.4 mg/dL (ref 0.3–1.0)
BUN/CREA RATIO: 20 (ref 6–22)
BUN: 14 mg/dL (ref 7–25)
CALCIUM, CORRECTED: 9.4 mg/dL (ref 8.9–10.8)
CALCIUM: 9.8 mg/dL (ref 8.6–10.3)
CHLORIDE: 106 mmol/L (ref 98–107)
CO2 TOTAL: 22 mmol/L (ref 21–31)
CREATININE: 0.69 mg/dL (ref 0.60–1.30)
ESTIMATED GFR: 96 mL/min/1.73mˆ2 (ref >59)
GLOBULIN: 3.5 (ref 2.0–3.5)
GLUCOSE: 97 mg/dL (ref 74–109)
OSMOLALITY, CALCULATED: 274 mosm/kg (ref 270–290)
POTASSIUM: 4.2 mmol/L (ref 3.5–5.1)
PROTEIN TOTAL: 8 g/dL (ref 6.4–8.9)
SODIUM: 137 mmol/L (ref 136–145)

## 2024-08-24 LAB — LACTIC ACID LEVEL W/ REFLEX FOR LEVEL >2.0: LACTIC ACID: 1.3 mmol/L (ref 0.5–2.2)

## 2024-08-24 LAB — POC BLOOD GLUCOSE (RESULTS): GLUCOSE, POC: 92 mg/dL (ref 70–100)

## 2024-08-24 LAB — C-REACTIVE PROTEIN (CRP): C-REACTIVE PROTEIN (CRP): <0.5 mg/dL (ref 0.1–0.5)

## 2024-08-24 MED ORDER — IOPAMIDOL 370 MG IODINE/ML (76 %) INTRAVENOUS SOLUTION
75.0000 mL | INTRAVENOUS | Status: AC
Start: 2024-08-24 — End: 2024-08-24
  Administered 2024-08-24: 75 mL via INTRAVENOUS

## 2024-08-24 NOTE — ED Nurses Note (Signed)
 Patient's IV removed, tip intact, and site without redness. Discharge instructions, follow up appointments, and AVS reviewed with patient. A written copy of the AVS and discharge instructions were given to the patient. All questions answered, and patient verbalized understanding. Patient discharged per order with all of their belongings. In the event of an emergency, patient instructed to call 911 or return to emergency room.    Hunter Queen, BSN, RN

## 2024-08-24 NOTE — Discharge Instructions (Signed)
 Apply benadryl lotion or cream to your neck a few times daily.      Follow up with your doctor.      You are safe to have a dental procedure from the standpoint of your neck.    Your dentist is welcome to contact us  for more information.

## 2024-08-24 NOTE — ED Triage Notes (Addendum)
 States was going to get her tooth filled and they sent her here for neck swelling. States has been turned away a couple times for about 3-4 weeks trying to get her tooth fixed. States L side neck swelling and pain. No difficulty breathing or SOB.

## 2024-08-24 NOTE — ED Provider Notes (Signed)
 Emergency Medicine    Name: Miranda Barker  Age and Gender: 68 y.o. female  Date of Birth: Sep 24, 1956  MRN: [id number]  PCP: [facility]    CC:  Chief Complaint   Patient presents with    Neck Swelling    Dental Problem       HPI:  Miranda Barker is a 68 y.o. White female with history of several weeks of left neck/jaw swelling and pain.  She denies fever. She sometimes has pain when she eats. She denies dyspnea.    She has a broken upper tooth and was seeing a dentist today about this, but was told that she needed to be here due to her neck swelling.    She denies weight loss, dyspnea or cough.      [place] Pain Rating Scale     On a scale of 0-10, during the past 24 hours, pain has interfered with you usual activity:       On a scale of 0-10, during the past 24 hours, pain has interfered with your sleep:      On a scale of 0-10, during the past 24 hours, pain has affected your mood:       On a scale of 0-10, during the past 24 hours, pain has contributed to your stress:       On a scale of 0-10, what is your overall pain Rating: 9        Below pertinent information reviewed with patient:  Past Medical History:   Diagnosis Date    Diabetes mellitus     Esophageal reflux     Essential hypertension     Migraine            Allergies[1]    Past Surgical History:   Procedure Laterality Date    HX HYSTERECTOMY      HX LAP CHOLECYSTECTOMY             Social History        Objective:    ED Triage Vitals [08/24/24 1029]   BP (Non-Invasive) (!) 187/90   Heart Rate 77   Respiratory Rate 20   Temperature 36.3 C (97.4 F)   SpO2 96 %   Weight 95.3 kg (210 lb)   Height 1.626 m (5' 4)     Filed Vitals:    08/24/24 1315 08/24/24 1330 08/24/24 1345 08/24/24 1400   BP:       Pulse:       Resp:       Temp:       SpO2: 96% 95% 95% 97%       Nursing notes and vital signs reviewed.    Constitutional - No acute distress.  Alert and Active.  HEENT - Normocephalic. Atraumatic. PERRL. EOMI. Conjunctiva clear. Oropharynx  with no erythema, lesions, or exudates. Moist mucous membranes.   Neck - Trachea midline. No stridor. No hoarseness. No adenopathy. There is mild redness on the left lateral neck, but it is non-tender.  Cardiac - Regular rate and rhythm. No murmurs, rubs, or gallops.  Respiratory - Clear to auscultation bilaterally. No rales, wheezes or rhonchi.  Abdomen - Non-tender, soft, non-distended. No rebound or guarding.   Musculoskeletal - Good AROM. No muscle or joint tenderness appreciated. No clubbing, cyanosis or edema.  Skin - Warm and dry, without any rashes or other lesions.  Neuro - Cranial nerves II-XII are grossly intact.  Moving all extremities symmetrically.    Any  pertinent labs and imaging obtained during this encounter reviewed below in MDM.    MDM/ED Course:      Patient's labs and CT soft tissue neck with contrast are negative.    Perhaps this is skin irritation. I have urged her to apply topical benadryl.      She is safe for dental procedure goin forward. Advised to return for any problems, follow up with her PCP.      Medical Decision Making  Amount and/or Complexity of Data Reviewed  Radiology: ordered.        Orders Placed This Encounter    CT SOFT TISSUE NECK W IV CONTRAST    CBC/DIFF    COMPREHENSIVE METABOLIC PANEL, NON-FASTING    LACTIC ACID LEVEL W/ REFLEX FOR LEVEL >2.0    C-REACTIVE PROTEIN (CRP)    CBC WITH DIFF    EXTRA TUBES    GOLD TOP TUBE    iopamidol  (ISOVUE -370) 76% infusion         Impression:   Clinical Impression   Skin irritation (Primary)       Disposition: Discharged          Portions of this note may have been dictated using voice recognition software.     Aliene Silvius, MD  [facility] ED    -----------------------  Results for orders placed or performed during the hospital encounter of 08/24/24 (from the past 12 hours)   COMPREHENSIVE METABOLIC PANEL, NON-FASTING   Result Value Ref Range    SODIUM 137 136 - 145 mmol/L    POTASSIUM 4.2 3.5 - 5.1 mmol/L    CHLORIDE  106 98 - 107 mmol/L    CO2 TOTAL 22 21 - 31 mmol/L    ANION GAP 9 4 - 13 mmol/L    BUN 14 7 - 25 mg/dL    CREATININE 9.30 9.39 - 1.30 mg/dL    BUN/CREA RATIO 20 6 - 22    ESTIMATED GFR 96 >59 mL/min/1.29m^2    ALBUMIN 4.5 3.5 - 5.7 g/dL    CALCIUM 9.8 8.6 - 89.6 mg/dL    GLUCOSE 97 74 - 890 mg/dL    ALKALINE PHOSPHATASE 94 34 - 104 U/L    ALT (SGPT) 23 7 - 52 U/L    AST (SGOT) 23 13 - 39 U/L    BILIRUBIN TOTAL 0.4 0.3 - 1.0 mg/dL    PROTEIN TOTAL 8.0 6.4 - 8.9 g/dL    ALBUMIN/GLOBULIN RATIO 1.3 0.8 - 1.4    OSMOLALITY, CALCULATED 274 270 - 290 mOsm/kg    CALCIUM, CORRECTED 9.4 8.9 - 10.8 mg/dL    GLOBULIN 3.5 2.0 - 3.5   LACTIC ACID LEVEL W/ REFLEX FOR LEVEL >2.0   Result Value Ref Range    LACTIC ACID 1.3 0.5 - 2.2 mmol/L   C-REACTIVE PROTEIN (CRP)   Result Value Ref Range    C-REACTIVE PROTEIN (CRP) <0.5 0.1 - 0.5 mg/dL   CBC WITH DIFF   Result Value Ref Range    WBC 7.8 3.8 - 11.8 x10^3/uL    RBC 4.77 3.63 - 4.92 x10^6/uL    HGB 14.5 (H) 10.9 - 14.3 g/dL    HCT 57.8 (H) 68.7 - 41.9 %    MCV 88.2 75.5 - 95.3 fL    MCH 30.4 24.7 - 32.8 pg    MCHC 34.4 32.3 - 35.6 g/dL    RDW 87.1 87.6 - 82.2 %    PLATELETS 302 140 - 440 x10^3/uL    MPV  8.5 7.9 - 10.8 fL    NEUTROPHIL % 55 43 - 77 %    LYMPHOCYTE % 36 16 - 46 %    MONOCYTE % 7 4 - 11 %    EOSINOPHIL % 1 1 - 7 %    BASOPHIL % 1 0 - 1 %    NEUTROPHIL # 4.30 1.90 - 8.20 x10^3/uL    LYMPHOCYTE # 2.80 1.10 - 3.10 x10^3/uL    MONOCYTE # 0.50 0.20 - 0.90 x10^3/uL    EOSINOPHIL # 0.10 0.00 - 0.50 x10^3/uL    BASOPHIL # 0.10 0.00 - 0.10 x10^3/uL   POC BLOOD GLUCOSE (RESULTS)   Result Value Ref Range    GLUCOSE, POC 92 70 - 100 mg/dl     CT SOFT TISSUE NECK W IV CONTRAST   Final Result   THERE IS NO FINDING TO EXPLAIN THE PATIENT'S SYMPTOMS. CLINICAL FOLLOW-UP IS RECOMMENDED.            Radiologist location ID: [id number]                  [1]   Allergies  Allergen Reactions    Tetracycline Nausea/ Vomiting    Bismuth Subcit K-Metronidz-Tcn Nausea/ Vomiting    Cefuroxime  Axetil Hives/ Urticaria    Sulfa (Sulfonamides)

## 2024-08-31 ENCOUNTER — Other Ambulatory Visit (HOSPITAL_COMMUNITY): Payer: Self-pay

## 2024-08-31 DIAGNOSIS — Z13820 Encounter for screening for osteoporosis: Secondary | ICD-10-CM

## 2024-09-22 ENCOUNTER — Ambulatory Visit (HOSPITAL_COMMUNITY): Payer: Self-pay

## 2024-10-25 ENCOUNTER — Other Ambulatory Visit: Payer: Self-pay

## 2024-10-25 ENCOUNTER — Ambulatory Visit
Admission: RE | Admit: 2024-10-25 | Discharge: 2024-10-25 | Disposition: A | Discharge status: Home / Self Care | Source: Ambulatory Visit

## 2024-10-25 DIAGNOSIS — Z13820 Encounter for screening for osteoporosis: Secondary | ICD-10-CM | POA: Insufficient documentation

## 2024-10-25 DIAGNOSIS — Z780 Asymptomatic menopausal state: Secondary | ICD-10-CM

## 2024-10-25 DIAGNOSIS — M8589 Other specified disorders of bone density and structure, multiple sites: Secondary | ICD-10-CM
